# Patient Record
Sex: Female | Born: 1955 | Race: Black or African American | Hispanic: No | Smoking: Current every day smoker
Health system: Southern US, Community
[De-identification: ages and names within clinical notes are randomized; demographics above are authoritative.]

## PROBLEM LIST (undated history)

## (undated) DIAGNOSIS — I10 Essential (primary) hypertension: Secondary | ICD-10-CM

## (undated) DIAGNOSIS — F209 Schizophrenia, unspecified: Secondary | ICD-10-CM

## (undated) HISTORY — DX: Schizophrenia, unspecified: F20.9

---

## 2003-02-16 ENCOUNTER — Other Ambulatory Visit: Payer: Self-pay

## 2010-08-21 ENCOUNTER — Emergency Department: Payer: Self-pay | Admitting: Emergency Medicine

## 2011-10-04 ENCOUNTER — Emergency Department: Payer: Self-pay | Admitting: *Deleted

## 2011-10-04 LAB — BASIC METABOLIC PANEL
Anion Gap: 10 (ref 7–16)
Calcium, Total: 8.6 mg/dL (ref 8.5–10.1)
Co2: 25 mmol/L (ref 21–32)
Creatinine: 0.95 mg/dL (ref 0.60–1.30)
EGFR (African American): >60
Osmolality: 276 (ref 275–301)
Potassium: 3.5 mmol/L (ref 3.5–5.1)

## 2011-10-04 LAB — CBC
HCT: 44.8 % (ref 35.0–47.0)
HGB: 14.3 g/dL (ref 12.0–16.0)
MCH: 30.8 pg (ref 26.0–34.0)
MCHC: 32 g/dL (ref 32.0–36.0)
RBC: 4.65 10*6/uL (ref 3.80–5.20)
RDW: 13.5 % (ref 11.5–14.5)

## 2011-10-04 LAB — CK TOTAL AND CKMB (NOT AT ARMC)
CK, Total: 117 U/L (ref 21–215)
CK-MB: 0.8 ng/mL (ref 0.5–3.6)

## 2014-03-24 ENCOUNTER — Other Ambulatory Visit: Payer: Self-pay | Admitting: Podiatry

## 2014-09-25 DIAGNOSIS — K739 Chronic hepatitis, unspecified: Secondary | ICD-10-CM | POA: Diagnosis not present

## 2014-09-25 DIAGNOSIS — N3942 Incontinence without sensory awareness: Secondary | ICD-10-CM | POA: Diagnosis not present

## 2014-10-17 DIAGNOSIS — K5900 Constipation, unspecified: Secondary | ICD-10-CM | POA: Diagnosis not present

## 2014-10-17 DIAGNOSIS — F209 Schizophrenia, unspecified: Secondary | ICD-10-CM | POA: Diagnosis not present

## 2014-10-17 DIAGNOSIS — I10 Essential (primary) hypertension: Secondary | ICD-10-CM | POA: Diagnosis not present

## 2014-10-17 DIAGNOSIS — K219 Gastro-esophageal reflux disease without esophagitis: Secondary | ICD-10-CM | POA: Diagnosis not present

## 2014-10-26 DIAGNOSIS — N3942 Incontinence without sensory awareness: Secondary | ICD-10-CM | POA: Diagnosis not present

## 2014-10-26 DIAGNOSIS — K739 Chronic hepatitis, unspecified: Secondary | ICD-10-CM | POA: Diagnosis not present

## 2014-11-02 DIAGNOSIS — I10 Essential (primary) hypertension: Secondary | ICD-10-CM | POA: Diagnosis present

## 2014-11-02 DIAGNOSIS — R079 Chest pain, unspecified: Secondary | ICD-10-CM | POA: Diagnosis not present

## 2014-11-02 DIAGNOSIS — K709 Alcoholic liver disease, unspecified: Secondary | ICD-10-CM | POA: Diagnosis present

## 2014-11-02 DIAGNOSIS — R0602 Shortness of breath: Secondary | ICD-10-CM | POA: Diagnosis not present

## 2014-11-02 DIAGNOSIS — F17200 Nicotine dependence, unspecified, uncomplicated: Secondary | ICD-10-CM | POA: Diagnosis not present

## 2014-11-02 DIAGNOSIS — Z8659 Personal history of other mental and behavioral disorders: Secondary | ICD-10-CM | POA: Insufficient documentation

## 2014-11-15 DIAGNOSIS — R0602 Shortness of breath: Secondary | ICD-10-CM | POA: Diagnosis not present

## 2014-11-15 DIAGNOSIS — R079 Chest pain, unspecified: Secondary | ICD-10-CM | POA: Diagnosis not present

## 2014-11-27 DIAGNOSIS — N3942 Incontinence without sensory awareness: Secondary | ICD-10-CM | POA: Diagnosis not present

## 2014-11-27 DIAGNOSIS — K739 Chronic hepatitis, unspecified: Secondary | ICD-10-CM | POA: Diagnosis not present

## 2014-12-05 DIAGNOSIS — F17200 Nicotine dependence, unspecified, uncomplicated: Secondary | ICD-10-CM | POA: Diagnosis not present

## 2014-12-05 DIAGNOSIS — R079 Chest pain, unspecified: Secondary | ICD-10-CM | POA: Diagnosis not present

## 2014-12-05 DIAGNOSIS — I10 Essential (primary) hypertension: Secondary | ICD-10-CM | POA: Diagnosis not present

## 2014-12-26 DIAGNOSIS — K739 Chronic hepatitis, unspecified: Secondary | ICD-10-CM | POA: Diagnosis not present

## 2014-12-26 DIAGNOSIS — N3942 Incontinence without sensory awareness: Secondary | ICD-10-CM | POA: Diagnosis not present

## 2015-01-26 DIAGNOSIS — K739 Chronic hepatitis, unspecified: Secondary | ICD-10-CM | POA: Diagnosis not present

## 2015-01-26 DIAGNOSIS — N3942 Incontinence without sensory awareness: Secondary | ICD-10-CM | POA: Diagnosis not present

## 2015-04-30 DIAGNOSIS — N3942 Incontinence without sensory awareness: Secondary | ICD-10-CM | POA: Diagnosis not present

## 2015-04-30 DIAGNOSIS — K739 Chronic hepatitis, unspecified: Secondary | ICD-10-CM | POA: Diagnosis not present

## 2015-05-28 DIAGNOSIS — K739 Chronic hepatitis, unspecified: Secondary | ICD-10-CM | POA: Diagnosis not present

## 2015-05-28 DIAGNOSIS — N3942 Incontinence without sensory awareness: Secondary | ICD-10-CM | POA: Diagnosis not present

## 2015-06-26 DIAGNOSIS — K739 Chronic hepatitis, unspecified: Secondary | ICD-10-CM | POA: Diagnosis not present

## 2015-06-26 DIAGNOSIS — N3942 Incontinence without sensory awareness: Secondary | ICD-10-CM | POA: Diagnosis not present

## 2015-07-26 DIAGNOSIS — K739 Chronic hepatitis, unspecified: Secondary | ICD-10-CM | POA: Diagnosis not present

## 2015-07-26 DIAGNOSIS — N3942 Incontinence without sensory awareness: Secondary | ICD-10-CM | POA: Diagnosis not present

## 2015-08-27 DIAGNOSIS — N3942 Incontinence without sensory awareness: Secondary | ICD-10-CM | POA: Diagnosis not present

## 2015-08-27 DIAGNOSIS — K739 Chronic hepatitis, unspecified: Secondary | ICD-10-CM | POA: Diagnosis not present

## 2015-09-21 ENCOUNTER — Emergency Department
Admission: EM | Admit: 2015-09-21 | Discharge: 2015-09-21 | Disposition: A | Discharge status: Home / Self Care | Payer: Medicare Other | Attending: Student | Admitting: Student

## 2015-09-21 ENCOUNTER — Encounter: Payer: Self-pay | Admitting: Emergency Medicine

## 2015-09-21 DIAGNOSIS — L304 Erythema intertrigo: Secondary | ICD-10-CM | POA: Insufficient documentation

## 2015-09-21 DIAGNOSIS — L988 Other specified disorders of the skin and subcutaneous tissue: Secondary | ICD-10-CM

## 2015-09-21 DIAGNOSIS — T69022A Immersion foot, left foot, initial encounter: Secondary | ICD-10-CM | POA: Diagnosis not present

## 2015-09-21 DIAGNOSIS — L539 Erythematous condition, unspecified: Secondary | ICD-10-CM | POA: Diagnosis present

## 2015-09-21 MED ORDER — IBUPROFEN 600 MG PO TABS: 600.0000 mg | ORAL_TABLET | Freq: Three times a day (TID) | ORAL | Status: DC

## 2015-09-21 MED ORDER — CEPHALEXIN 500 MG PO CAPS: 500.0000 mg | ORAL_CAPSULE | Freq: Four times a day (QID) | ORAL | Status: AC

## 2015-09-21 MED ORDER — MUPIROCIN CALCIUM 2 % EX CREA
TOPICAL_CREAM | Freq: Once | CUTANEOUS | Status: DC
  Filled 2015-09-21: qty 15

## 2015-09-21 MED ORDER — MUPIROCIN 2 % EX OINT: TOPICAL_OINTMENT | CUTANEOUS | Status: DC

## 2015-09-21 NOTE — ED Provider Notes (Signed)
[facility] Emergency Department Provider Note  ____________________________________________  Time seen: Approximately 11:50 AM  I have reviewed the triage vital signs and the nursing notes.   HISTORY  Chief Complaint Rash    HPI Jessica Luna is a 60 y.o. female presents for evaluation of rash to her left upper thigh. Patient states that she has a history of 6 urinating on herself and leaking through the diapers and she feels like her skin is just rubbing together becoming infected. Pain is described as minimal. But has noticed increased maceration of her skin.   History reviewed. No pertinent past medical history.  There are no active problems to display for this patient.   History reviewed. No pertinent past surgical history.  Current Outpatient Rx  Name  Route  Sig  Dispense  Refill  . cephALEXin (KEFLEX) 500 MG capsule   Oral   Take 1 capsule (500 mg total) by mouth 4 (four) times daily.   40 capsule   0   . ibuprofen (ADVIL,MOTRIN) 600 MG tablet   Oral   Take 1 tablet (600 mg total) by mouth 3 (three) times daily.   30 tablet   0   . mupirocin ointment (BACTROBAN) 2 %      Apply to affected area 3 times daily   22 g   0     Allergies Review of patient's allergies indicates no known allergies.  No family history on file.  Social History Social History  Substance Use Topics  . Smoking status: Never Smoker   . Smokeless tobacco: None  . Alcohol Use: None    Review of Systems Constitutional: No fever/chills Cardiovascular: Denies chest pain. Respiratory: Denies shortness of breath. Gastrointestinal: No abdominal pain.  No nausea, no vomiting.  No diarrhea.  No constipation. Genitourinary: Negative for dysuria. Skin: Positive for skin irritation/maceration. Neurological: Negative for headaches, focal weakness or numbness.  10-point ROS otherwise negative.  ____________________________________________   PHYSICAL  EXAM:  VITAL SIGNS: ED Triage Vitals  Enc Vitals Group     BP 09/21/15 1147 149/88 mmHg     Pulse Rate 09/21/15 1147 85     Resp 09/21/15 1147 16     Temp 09/21/15 1147 97.6 F (36.4 C)     Temp Source 09/21/15 1147 Oral     SpO2 09/21/15 1147 98 %     Weight 09/21/15 1147 185 lb (83.915 kg)     Height 09/21/15 1147 5\' 1"  (1.549 m)     Head Cir --      Peak Flow --      Pain Score 09/21/15 1129 5     Pain Loc --      Pain Edu? --      Excl. in [place]? --     Constitutional: Alert and oriented. Well appearing and in no acute distress. Cardiovascular: Normal rate, regular rhythm. Grossly normal heart sounds.  Good peripheral circulation. Respiratory: Normal respiratory effort.  No retractions. Lungs CTAB. Musculoskeletal: No lower extremity tenderness nor edema.  No joint effusions. Neurologic:  Normal speech and language. No gross focal neurologic deficits are appreciated. No gait instability. Skin:  Skin is warm, dry,. There is an area of erythema with increased irritation noted to the skin. Psychiatric: Mood and affect are normal. Speech and behavior are normal.  ____________________________________________   LABS (all labs ordered are listed, but only abnormal results are displayed)  Labs Reviewed - No data to display ____________________________________________  EKG   ____________________________________________  RADIOLOGY   ____________________________________________   PROCEDURES  Procedure(s) performed: None  Critical Care performed: No  ____________________________________________   INITIAL IMPRESSION / ASSESSMENT AND PLAN / ED COURSE  Pertinent labs & imaging results that were available during my care of the patient were reviewed by me and considered in my medical decision making (see chart for details).  Skin maceration. Rx given for Keflex 500 mg 4 times a day and Bactroban ointment. Patient follow-up closely with PCP for continued wound  evaluation. Concern for decubiti development if not careful. ____________________________________________   FINAL CLINICAL IMPRESSION(S) / ED DIAGNOSES  Final diagnoses:  Maceration of skin     This chart was dictated using voice recognition software/Dragon. Despite best efforts to proofread, errors can occur which can change the meaning. Any change was purely unintentional.   Arlyss Repress, PA-C 09/21/15 1256  Joanne Gavel, MD 09/21/15 1550

## 2015-09-21 NOTE — ED Notes (Signed)
Reports rash on left thigh.

## 2015-09-21 NOTE — ED Notes (Signed)
States she noticed a rash area to inside left upper leg several days ago . Area noted to thigh no drainage

## 2015-09-25 DIAGNOSIS — N3942 Incontinence without sensory awareness: Secondary | ICD-10-CM | POA: Diagnosis not present

## 2015-09-25 DIAGNOSIS — K739 Chronic hepatitis, unspecified: Secondary | ICD-10-CM | POA: Diagnosis not present

## 2015-10-26 DIAGNOSIS — K739 Chronic hepatitis, unspecified: Secondary | ICD-10-CM | POA: Diagnosis not present

## 2015-11-28 DIAGNOSIS — I119 Hypertensive heart disease without heart failure: Secondary | ICD-10-CM | POA: Diagnosis not present

## 2016-01-04 DIAGNOSIS — K739 Chronic hepatitis, unspecified: Secondary | ICD-10-CM | POA: Diagnosis not present

## 2016-02-04 DIAGNOSIS — K739 Chronic hepatitis, unspecified: Secondary | ICD-10-CM | POA: Diagnosis not present

## 2016-03-05 DIAGNOSIS — K739 Chronic hepatitis, unspecified: Secondary | ICD-10-CM | POA: Diagnosis not present

## 2016-05-22 ENCOUNTER — Emergency Department
Admission: EM | Admit: 2016-05-22 | Discharge: 2016-05-23 | Disposition: A | Discharge status: Home / Self Care | Payer: Medicare HMO | Attending: Emergency Medicine | Admitting: Emergency Medicine

## 2016-05-22 ENCOUNTER — Emergency Department: Payer: Medicare HMO

## 2016-05-22 DIAGNOSIS — Z791 Long term (current) use of non-steroidal anti-inflammatories (NSAID): Secondary | ICD-10-CM | POA: Insufficient documentation

## 2016-05-22 DIAGNOSIS — I10 Essential (primary) hypertension: Secondary | ICD-10-CM | POA: Diagnosis not present

## 2016-05-22 DIAGNOSIS — D259 Leiomyoma of uterus, unspecified: Secondary | ICD-10-CM | POA: Insufficient documentation

## 2016-05-22 DIAGNOSIS — N939 Abnormal uterine and vaginal bleeding, unspecified: Secondary | ICD-10-CM | POA: Diagnosis present

## 2016-05-22 HISTORY — DX: Essential (primary) hypertension: I10

## 2016-05-22 LAB — CBC
HCT: 36.8 % (ref 35.0–47.0)
HEMOGLOBIN: 12.2 g/dL (ref 12.0–16.0)
MCH: 30.4 pg (ref 26.0–34.0)
MCHC: 33.3 g/dL (ref 32.0–36.0)
MCV: 91.3 fL (ref 80.0–100.0)
Platelets: 411 10*3/uL (ref 150–440)
RBC: 4.03 MIL/uL (ref 3.80–5.20)
RDW: 12.8 % (ref 11.5–14.5)
WBC: 11.1 10*3/uL — ABNORMAL HIGH (ref 3.6–11.0)

## 2016-05-22 LAB — COMPREHENSIVE METABOLIC PANEL
ALK PHOS: 50 U/L (ref 38–126)
ALT: 15 U/L (ref 14–54)
AST: 27 U/L (ref 15–41)
Albumin: 3.1 g/dL — ABNORMAL LOW (ref 3.5–5.0)
Anion gap: 6 (ref 5–15)
BILIRUBIN TOTAL: 0.2 mg/dL — AB (ref 0.3–1.2)
BUN: 5 mg/dL — ABNORMAL LOW (ref 6–20)
CO2: 30 mmol/L (ref 22–32)
CREATININE: 0.87 mg/dL (ref 0.44–1.00)
Calcium: 8.6 mg/dL — ABNORMAL LOW (ref 8.9–10.3)
Chloride: 100 mmol/L — ABNORMAL LOW (ref 101–111)
Glucose, Bld: 162 mg/dL — ABNORMAL HIGH (ref 65–99)
Potassium: 3.8 mmol/L (ref 3.5–5.1)
Sodium: 136 mmol/L (ref 135–145)
TOTAL PROTEIN: 7.9 g/dL (ref 6.5–8.1)

## 2016-05-22 LAB — URINALYSIS, COMPLETE (UACMP) WITH MICROSCOPIC
BACTERIA UA: NONE SEEN
Bilirubin Urine: NEGATIVE
Glucose, UA: NEGATIVE mg/dL
Ketones, ur: NEGATIVE mg/dL
Leukocytes, UA: NEGATIVE
NITRITE: NEGATIVE
Protein, ur: NEGATIVE mg/dL
SPECIFIC GRAVITY, URINE: 1.015 (ref 1.005–1.030)
pH: 5 (ref 5.0–8.0)

## 2016-05-22 NOTE — ED Notes (Signed)
Pelvic done by Dr. Alfred Levins. Very small amount of blood noted. No clots seen.

## 2016-05-22 NOTE — ED Notes (Addendum)
Pt ambulated into hallway asking about toilet, re-directed back into room and shown the toilet in room. Informed pt that this RN will come back to assess her in a few minutes.

## 2016-05-22 NOTE — ED Provider Notes (Signed)
[facility] Emergency Department Provider Note  ____________________________________________  Time seen: Approximately 11:18 PM  I have reviewed the triage vital signs and the nursing notes.   HISTORY  Chief Complaint Vaginal Bleeding   HPI Jessica Luna is a 61 y.o. female with a history of schizophrenia and hypertensionwho presents for evaluation of vaginal bleeding. Patient reports that she has been having intermittent vaginal spotting for the last 2-3 months. She reports that she has been in menopause for at least 3 years. She told her daughter yesterday that she was having vaginal bleeding and since she didn't go see her primary care doctor yesterday, the daughter decided to bring her in for evaluation. The daughter did not know the patient has been having these episodes for a few months. Patient also endorses lower sharp abdominal pain that is only present when she bears down. Pain is located in the suprapubic region.  No pain at this time. Patient denies vaginal discharge, dysuria, nausea, vomiting, diarrhea, melena, hematemesis. She denies dizziness, chest pain or shortness of breath.  Past Medical History:  Diagnosis Date  . Hypertension     There are no active problems to display for this patient.   No past surgical history on file.  Prior to Admission medications   Medication Sig Start Date End Date Taking? Authorizing Provider  ibuprofen (ADVIL,MOTRIN) 600 MG tablet Take 1 tablet (600 mg total) by mouth 3 (three) times daily. 09/21/15   Arlyss Repress, PA-C  mupirocin ointment Drue Stager) 2 % Apply to affected area 3 times daily 09/21/15 09/20/16  Arlyss Repress, PA-C    Allergies Patient has no known allergies.  No family history on file.  Social History Social History  Substance Use Topics  . Smoking status: Never Smoker  . Smokeless tobacco: Not on file  . Alcohol use Not on file    Review of Systems  Constitutional: Negative  for fever. Eyes: Negative for visual changes. ENT: Negative for sore throat. Neck: No neck pain  Cardiovascular: Negative for chest pain. Respiratory: Negative for shortness of breath. Gastrointestinal: + lower abdominal pain. No vomiting or diarrhea. Genitourinary: Negative for dysuria. + vaginal bleeding Musculoskeletal: Negative for back pain. Skin: Negative for rash. Neurological: Negative for headaches, weakness or numbness. Psych: No SI or HI  ____________________________________________   PHYSICAL EXAM:  VITAL SIGNS: ED Triage Vitals [05/22/16 2127]  Enc Vitals Group     BP (!) 158/91     Pulse Rate (!) 113     Resp 18     Temp 99.3 F (37.4 C)     Temp Source Oral     SpO2 98 %     Weight 180 lb (81.6 kg)     Height 5\' 1"  (1.549 m)     Head Circumference      Peak Flow      Pain Score 7     Pain Loc      Pain Edu?      Excl. in [place]?     Constitutional: Alert and oriented. Well appearing and in no apparent distress. HEENT:      Head: Normocephalic and atraumatic.         Eyes: Conjunctivae are normal. Sclera is non-icteric. EOMI. PERRL      Mouth/Throat: Mucous membranes are moist.       Neck: Supple with no signs of meningismus. Cardiovascular: Regular rate and rhythm. No murmurs, gallops, or rubs. 2+ symmetrical distal pulses are present in all  extremities. No JVD. Respiratory: Normal respiratory effort. Lungs are clear to auscultation bilaterally. No wheezes, crackles, or rhonchi.  Gastrointestinal: Soft, mild ttp over the lower quadrants worse over the suprapubic region. Mass palpable over the RLQ, non distended with positive bowel sounds. No rebound or guarding. Pelvic exam: Normal external genitalia, no rashes or lesions. Small amount of blood in the vaginal vault. Os closed. No cervical motion tenderness.  No uterine or adnexal tenderness.   Musculoskeletal: Nontender with normal range of motion in all extremities. No edema, cyanosis, or erythema of  extremities. Neurologic: Normal speech and language. Face is symmetric. Moving all extremities. No gross focal neurologic deficits are appreciated. Skin: Skin is warm, dry and intact. No rash noted. Psychiatric: Mood and affect are normal. Speech and behavior are normal.  ____________________________________________   LABS (all labs ordered are listed, but only abnormal results are displayed)  Labs Reviewed  CBC - Abnormal; Notable for the following:       Result Value   WBC 11.1 (*)    All other components within normal limits  COMPREHENSIVE METABOLIC PANEL - Abnormal; Notable for the following:    Chloride 100 (*)    Glucose, Bld 162 (*)    BUN 5 (*)    Calcium 8.6 (*)    Albumin 3.1 (*)    Total Bilirubin 0.2 (*)    All other components within normal limits  URINALYSIS, COMPLETE (UACMP) WITH MICROSCOPIC - Abnormal; Notable for the following:    Color, Urine YELLOW (*)    APPearance HAZY (*)    Hgb urine dipstick LARGE (*)    Squamous Epithelial / LPF 6-30 (*)    All other components within normal limits   ____________________________________________  EKG  none ____________________________________________  RADIOLOGY  TVUS: Limited study due to acoustic degradation from masses measuring up to 12 cm; these are not conclusively characterized but probably represent fibroids. Neither ovary was visible. Endometrium not well seen. Pelvic MRI would be the modality of choice for additional imaging evaluation.  CT a/p: Markedly enlarged uterus containing what appear to be numerous fibroids some of which are hypodense and necrotic in appearance attention contributing to the patient's vaginal bleeding. MRI is recommended for further assessment to exclude leiomyosarcoma. Pertinent negatives with the no metastatic foci of the liver nor visualized lung bases. There are small retroperitoneal lymph nodes however noted measuring up to 1.5  cm. ____________________________________________   PROCEDURES  Procedure(s) performed: None Procedures Critical Care performed:  None ____________________________________________   INITIAL IMPRESSION / ASSESSMENT AND PLAN / ED COURSE   61 y.o. female with a history of schizophrenia and hypertensionwho presents for evaluation of vaginal spotting intermittently for 2-3 months and lower abdominal pain when she bears down. On exam patient is well-appearing, with no distress, pelvic exam shows small amount of blood in the vaginal vault, normal os. Palpation of her abdomen reveals a mass on the right lower quadrant and mild tenderness to palpation on the lower quadrants with no rebound or guarding. We'll send patient for a transvaginal ultrasound and if that is negative for CT abdomen and pelvis.  Clinical Course as of May 23 349  Fri May 23, 2016  0347 CT showing Markedly enlarged uterus containing what appear to be numerous fibroids some of which are hypodense and necrotic in appearance attention contributing to the patient's vaginal bleeding. MRI is recommended for further assessment to exclude leiomyosarcoma. Pertinent negatives with the no metastatic foci of the liver nor visualized lung bases. There are  small retroperitoneal lymph nodes.  I discussed these findings with patient and recommended close follow up with OBgyn for further management including further imaging and possible biopsy/ Patient will follow up with Dr. Glennon Mac, [facility]. Patient has medicaid to ensure close follow up. I explained to the patient and her daughter that she needs close evaluation to rule out malignancy. Daughter will call the clinic tomorrow first thing for an appointment. however noted measuring up to 1.5 cm.  [CV]    Clinical Course User Index [CV] Rudene Re, MD    Pertinent labs & imaging results that were available during my care of the patient were reviewed by me and considered in my  medical decision making (see chart for details).    ____________________________________________   FINAL CLINICAL IMPRESSION(S) / ED DIAGNOSES  Final diagnoses:  Vaginal bleeding  Uterine leiomyoma, unspecified location      NEW MEDICATIONS STARTED DURING THIS VISIT:  New Prescriptions   No medications on file     Note:  This document was prepared using Dragon voice recognition software and may include unintentional dictation errors.    Rudene Re, MD 05/23/16 [phone]

## 2016-05-22 NOTE — ED Triage Notes (Signed)
Pt in with co lower abd pain and vaginal bleeding for 2 weeks, has not had cycles for years and unsure of cause. Pt has appointment Monday for pcp but wants to be checked out today. Pt also co lower abd pain.

## 2016-05-23 ENCOUNTER — Emergency Department: Payer: Medicare HMO

## 2016-05-23 DIAGNOSIS — D259 Leiomyoma of uterus, unspecified: Secondary | ICD-10-CM | POA: Diagnosis not present

## 2016-05-23 MED ORDER — IOPAMIDOL (ISOVUE-300) INJECTION 61%
100.0000 mL | Freq: Once | INTRAVENOUS | Status: AC | PRN
  Administered 2016-05-23: 100 mL via INTRAVENOUS

## 2016-05-23 MED ORDER — IOPAMIDOL (ISOVUE-300) INJECTION 61%
30.0000 mL | Freq: Once | INTRAVENOUS | Status: AC | PRN
  Administered 2016-05-23: 30 mL via ORAL

## 2016-05-23 MED ORDER — KETOROLAC TROMETHAMINE 30 MG/ML IJ SOLN
10.0000 mg | Freq: Once | INTRAMUSCULAR | Status: AC
  Administered 2016-05-23: 9.9 mg via INTRAVENOUS
  Filled 2016-05-23: qty 1

## 2016-05-23 NOTE — ED Notes (Signed)
Pt ambulatory to toilet with no assistance.  

## 2016-05-23 NOTE — Discharge Instructions (Signed)
As I explained to you and your CT scan showed multiple masses in your uterus. We are unable to determine if these masses are cancer at this time. Therefore it is imperative that you call OB/GYN tomorrow for close follow-up so you can undergo further evaluation. In the meantime, return to the emergency room if you have worsening vaginal bleeding, worsening abdominal pain, or any new symptoms that were not present today.

## 2016-05-23 NOTE — ED Notes (Signed)
Pt finished drinking contrast, CT informed

## 2016-05-27 ENCOUNTER — Telehealth: Payer: Self-pay | Admitting: Obstetrics and Gynecology

## 2016-05-27 NOTE — Telephone Encounter (Signed)
Voicemail not set up, patient needs to schedule ER follow up.

## 2016-06-24 ENCOUNTER — Ambulatory Visit (INDEPENDENT_AMBULATORY_CARE_PROVIDER_SITE_OTHER): Payer: Commercial Managed Care - HMO | Admitting: Obstetrics and Gynecology

## 2016-06-24 ENCOUNTER — Encounter: Payer: Self-pay | Admitting: Obstetrics and Gynecology

## 2016-06-24 VITALS — BP 128/74 | Ht 61.0 in | Wt 180.0 lb

## 2016-06-24 DIAGNOSIS — N950 Postmenopausal bleeding: Secondary | ICD-10-CM | POA: Diagnosis not present

## 2016-06-24 DIAGNOSIS — R1030 Lower abdominal pain, unspecified: Secondary | ICD-10-CM | POA: Diagnosis not present

## 2016-06-24 MED ORDER — TRAMADOL HCL 50 MG PO TABS: 50.0000 mg | ORAL_TABLET | Freq: Four times a day (QID) | ORAL | 0 refills | Status: DC | PRN

## 2016-06-24 NOTE — Progress Notes (Signed)
Obstetrics & Gynecology Office Visit   Chief Complaint  Patient presents with  . ER Follow Up  postmenopausal bleeding, lower abdominal pain  History of Present Illness:  Postmenopausal Bleeding: Patient complains of vaginal bleeding. She has been menopausal for 3 years. Currently on no HRT. Bleeding is described as spotting and has occurred 2 times. Other menopausal symptoms: none. Workup to date: CBC, pelvic ultrasound and CT abdomen/pelvis. She presented to the ER on 05/22/16 for primarily abdominal pain (see below). She also noted an episode of vaginal spotting that lasted a few days. She had a previous episode of spotting a couple of months ago.  She does not remember when she has had a pap smear last.  She had a CT scan in the ER that showed multiple large masses, likely fibroids, though leiomyosarcoma could not be ruled out.  The larges mass was 12 cm.  She also had a pelvic ultrasound that could adequately visualize her endometrial stripe due to the shadowing effects of her uterine masses.    Abdominal Pain: Patient complains of abdominal pain. The pain is described as cramping and dull, and is 5/10 in intensity. Pain is located in the suprapubic with radiation to low back. Onset was 2 months ago. Symptoms have been gradually worsening since. Aggravating factors: activity.  Alleviating factors: none. Associated symptoms: constipation and early satiety, and bloating. The patient denies diarrhea, frequency, hematochezia, hematuria, melena, nausea and vomiting. Prior to 2 months ago she has never had abdominal pain like this.   The patient states that she had normal menses until she went through the menopausal transition.  Her menses were never heavy nor painful.  She does not have a history of abdominal discomfort.    Review of Systems  Constitutional: Negative.   HENT: Negative.   Eyes: Negative.   Respiratory: Negative.   Cardiovascular: Negative.   Gastrointestinal: Positive for  abdominal pain and constipation. Negative for blood in stool, diarrhea, melena, nausea and vomiting.       Bloating, early satiety  Genitourinary: Negative.        +vaginal spotting  Musculoskeletal: Negative.   Skin: Negative.   Neurological: Negative.   Psychiatric/Behavioral: Negative.     Past Medical History:  Diagnosis Date  . Hypertension   . Schizophrenia [facility])     Past Surgical History:  Procedure Laterality Date  . CESAREAN SECTION      Gynecologic History: No LMP recorded. Patient is postmenopausal.  Obstetric History: [id number], status post cesarean section x 4, one abortion  Family History  Problem Relation Age of Onset  . Diabetes Father   Denies gynecologic cancers  Social History   Social History  . Marital status: Widowed    Spouse name: N/A  . Number of children: N/A  . Years of education: N/A   Occupational History  . Not on file.   Social History Main Topics  . Smoking status: Current Every Day Smoker  . Smokeless tobacco: Never Used  . Alcohol use Yes  . Drug use: Unknown  . Sexual activity: Not Currently    Birth control/ protection: Post-menopausal   Other Topics Concern  . Not on file   Social History Narrative  . No narrative on file    Allergies: No Known Allergies  Medications:   Medication Sig Start Date End Date Taking? Authorizing Provider  amLODipine-benazepril (LOTREL) 10-20 MG capsule  04/13/16  Yes Historical Provider, MD  risperiDONE (RISPERDAL) 2 MG tablet  10/26/14  Yes Historical  Provider, MD  ibuprofen (ADVIL,MOTRIN) 600 MG tablet Take 1 tablet (600 mg total) by mouth 3 (three) times daily. Patient not taking: Reported on 06/24/2016 09/21/15   Arlyss Repress, PA-C  mupirocin ointment Drue Stager) 2 % Apply to affected area 3 times daily Patient not taking: Reported on 06/24/2016 09/21/15 09/20/16  Arlyss Repress, PA-C   Physical Exam BP 128/74   Ht 5\' 1"  (1.549 m)   Wt 180 lb (81.6 kg)   BMI 34.01 kg/m   No LMP  recorded. Patient is postmenopausal. Physical Exam  Constitutional: She is oriented to person, place, and time. She appears well-developed and well-nourished. No distress.  Genitourinary: Pelvic exam was performed with patient supine. There is no rash, tenderness, lesion or injury on the right labia. There is no rash, tenderness or injury on the left labia. No erythema, tenderness or bleeding in the vagina. No signs of injury around the vagina. Cervix does not exhibit motion tenderness.   Uterus is enlarged, exhibiting a mass, irregular and anteverted. Uterus is not tender or mobile.  Genitourinary Comments: Unable to visualize cervix as it is immediately retropubic.   Uterus is enlarged and consistent with abdominal exam anteriorly.   Rectovaginal confirms bimanual. Posteriorly her uterus/fibroids fill the pelvis and there is very limited mobility.   HENT:  Head: Normocephalic and atraumatic.  Eyes: EOM are normal. No scleral icterus.  Neck: Normal range of motion. Neck supple. No thyromegaly present.  Cardiovascular: Normal rate and regular rhythm.  Exam reveals no gallop and no friction rub.   No murmur heard. Pulmonary/Chest: Effort normal. No respiratory distress. She has wheezes. She has no rales. Right breast exhibits no inverted nipple, no mass, no nipple discharge, no skin change and no tenderness. Left breast exhibits no inverted nipple, no mass, no nipple discharge, no skin change and no tenderness.  Abdominal: Soft. Bowel sounds are normal. She exhibits mass (mass extends up to about 2 cm inferior to umbilicus, about 12 cm to right of midlines and about 6cm left of midline). She exhibits no distension. There is no tenderness. There is no rebound and no guarding.  Musculoskeletal: Normal range of motion. She exhibits no edema or tenderness.  Lymphadenopathy:    She has no cervical adenopathy.       Right: No inguinal adenopathy present.       Left: No inguinal adenopathy present.    Neurological: She is alert and oriented to person, place, and time. No cranial nerve deficit.  Skin: Skin is warm and dry. No rash noted. No erythema.  Psychiatric: She has a normal mood and affect. Her behavior is normal. Judgment normal.   Female chaperone present for pelvic and breast  portions of the physical exam  CT abdomen/pelvis report (05/23/16): Vascular/Lymphatic: No aortic aneurysm. Mildly enlarged retroperitoneal, para-aortic lymph nodes the largest approximately 1.6 cm short axis at the bifurcation.  Reproductive: The uterus is markedly enlarged measuring 15.5 cm AP x 13.4 cm craniocaudad by 16.9 cm transverse. It demonstrates heterogeneous enhancement predominantly solid and anterior on the right and more hypodense cystic or necrotic posteriorly and on the left. Speckled coarse calcifications are noted within the right-sided uterine masses. Findings are likely related to numerous leiomyomas however the possibility of leiomyosarcoma is of concern. MRI is recommended for further evaluation. Neither ovary is visualized.  IMPRESSION: Markedly enlarged uterus containing what appear to be numerous fibroids some of which are hypodense and necrotic in appearance attention contributing to the patient's vaginal bleeding. MRI is  recommended for further assessment to exclude leiomyosarcoma. Pertinent negatives with the no metastatic foci of the liver nor visualized lung bases. There are small retroperitoneal lymph nodes however noted measuring up to 1.5 cm.  Pelvic ultrasound report (05/23/16): FINDINGS: Uterus Measurements: 15.9 x 9.3 x 15.1 cm. At least 2 masses, one located in the fundus to the right of midline measuring 7 cm and another mass to the left of the uterus measuring up to 12 cm.  Endometrium Thickness: 6.0 mm. Poorly seen due to acoustic degradation from the masses.  Right ovary: Not visible  Left ovary: Not visible  Other findings: No abnormal free  fluid.  IMPRESSION: Limited study due to acoustic degradation from masses measuring up to 12 cm; these are not conclusively characterized but probably represent fibroids. Neither ovary was visible. Endometrium not well seen. Pelvic MRI would be the modality of choice for additional imaging evaluation.  Assessment: 61 y.o. [id number] with postmenopausal bleeding and suprapubic abdominal pain.  Imaging findings concerning for uterine fibroids, but leiomyosarcoma could not be ruled out.   Plan: Postmenopausal bleeding: I was unable to perform a pap smear and an endometrial biopsy in the office today given the location of her cervix being directly behind the pubic bone and her intolerance of the exam.   Her imaging findings, along with her new-onset abdominal pain and vaginal bleeding have me concerned for malignancy.  This is especially true given that she had no abnormal uterine bleeding or abdominal pain during her pre-menstrual years.   I will refer her to [facility] for further assessment and recommendations.  Discussed that an MRI might be beneficial. However, given her new-onset symptoms, she would like need, at a minimum, an exam under anesthesia for endometrial pathology assessment.  Also, given her symptoms, it may be recommended that she have a hysterectomy. Given the size of her uterus and some concern for malignancy, this is why I will refer her to [facility]. Ideally, I would be able to perform a pap smear and an endometrial biopsy, but I am unable to do so in clinic today.    I explained this recommendation to her along with my rationale and my concern for malignancy with difficulty in assessing this short of performing a hysterectomy.  She voiced understanding of the importance of keeping follow up appointments and referrals.   For this appointment, I reviewed ER records and imaging and had a long, face-to-face discussion with her regarding my recommendation. About 45 minutes  spent in total on this patient.   Prentice Docker, MD 06/24/2016 5:35 PM

## 2016-07-02 ENCOUNTER — Inpatient Hospital Stay: Payer: Medicare HMO

## 2016-11-13 ENCOUNTER — Encounter: Payer: Self-pay | Admitting: Emergency Medicine

## 2016-11-13 ENCOUNTER — Emergency Department: Payer: Medicare HMO

## 2016-11-13 ENCOUNTER — Emergency Department
Admission: EM | Admit: 2016-11-13 | Discharge: 2016-11-13 | Disposition: A | Discharge status: Home / Self Care | Payer: Medicare HMO | Attending: Emergency Medicine | Admitting: Emergency Medicine

## 2016-11-13 DIAGNOSIS — R1013 Epigastric pain: Secondary | ICD-10-CM

## 2016-11-13 DIAGNOSIS — F209 Schizophrenia, unspecified: Secondary | ICD-10-CM | POA: Insufficient documentation

## 2016-11-13 DIAGNOSIS — I10 Essential (primary) hypertension: Secondary | ICD-10-CM | POA: Insufficient documentation

## 2016-11-13 DIAGNOSIS — R109 Unspecified abdominal pain: Secondary | ICD-10-CM

## 2016-11-13 DIAGNOSIS — F17210 Nicotine dependence, cigarettes, uncomplicated: Secondary | ICD-10-CM | POA: Diagnosis not present

## 2016-11-13 DIAGNOSIS — Z79899 Other long term (current) drug therapy: Secondary | ICD-10-CM | POA: Insufficient documentation

## 2016-11-13 DIAGNOSIS — R079 Chest pain, unspecified: Secondary | ICD-10-CM | POA: Diagnosis present

## 2016-11-13 LAB — HEPATIC FUNCTION PANEL
AST: 82 U/L — AB (ref 15–41)
Albumin: <1 g/dL — ABNORMAL LOW (ref 3.5–5.0)
Alkaline Phosphatase: 15 U/L — ABNORMAL LOW (ref 38–126)
BILIRUBIN TOTAL: 0.1 mg/dL — AB (ref 0.3–1.2)
Total Protein: <3 g/dL — ABNORMAL LOW (ref 6.5–8.1)

## 2016-11-13 LAB — CBC
HCT: 40.7 % (ref 35.0–47.0)
Hemoglobin: 14 g/dL (ref 12.0–16.0)
MCH: 31.6 pg (ref 26.0–34.0)
MCHC: 34.5 g/dL (ref 32.0–36.0)
MCV: 91.6 fL (ref 80.0–100.0)
Platelets: 208 10*3/uL (ref 150–440)
RBC: 4.45 MIL/uL (ref 3.80–5.20)
RDW: 13 % (ref 11.5–14.5)
WBC: 5.2 10*3/uL (ref 3.6–11.0)

## 2016-11-13 LAB — BASIC METABOLIC PANEL
ANION GAP: 11 (ref 5–15)
BUN: 9 mg/dL (ref 6–20)
CO2: 25 mmol/L (ref 22–32)
Calcium: 8.8 mg/dL — ABNORMAL LOW (ref 8.9–10.3)
Chloride: 98 mmol/L — ABNORMAL LOW (ref 101–111)
Creatinine, Ser: 1.1 mg/dL — ABNORMAL HIGH (ref 0.44–1.00)
GFR, EST NON AFRICAN AMERICAN: 54 mL/min — AB (ref >60)
GLUCOSE: 86 mg/dL (ref 65–99)
Potassium: 3.8 mmol/L (ref 3.5–5.1)
Sodium: 134 mmol/L — ABNORMAL LOW (ref 135–145)

## 2016-11-13 LAB — LIPASE, BLOOD: Lipase: 33 U/L (ref 11–51)

## 2016-11-13 LAB — TROPONIN I
Troponin I: <0.03 ng/mL (ref <0.03)
Troponin I: <0.03 ng/mL

## 2016-11-13 MED ORDER — OMEPRAZOLE 20 MG PO CPDR: 20.0000 mg | DELAYED_RELEASE_CAPSULE | Freq: Every day | ORAL | 0 refills | Status: DC

## 2016-11-13 NOTE — ED Notes (Signed)
Pt provided graham crackers w/ peanut butter and chocolate milk upon request.

## 2016-11-13 NOTE — ED Notes (Signed)
Patient transported to X-ray 

## 2016-11-13 NOTE — Discharge Instructions (Signed)
If you have new or worrisome symptms including chest pain, shortness of breath, breathing diffuculties, bleeding from your bottom or black stools, or vomiting or you feel worse in any way return to the emergency department.

## 2016-11-13 NOTE — ED Notes (Signed)
Patient transported to Ultrasound 

## 2016-11-13 NOTE — ED Triage Notes (Signed)
Pt reports right side non radiating chest pain without associated symptoms for one week.Pt ambulatory to triage. No apparent distress noted.

## 2016-11-13 NOTE — ED Provider Notes (Signed)
[facility] Emergency Department Provider Note  ____________________________________________   I have reviewed the triage vital signs and the nursing notes.   HISTORY  Chief Complaint Chest Pain    HPI Jessica Luna is a 61 y.o. female Who pas a history of schizophrenia, as well ashypertension presents today complaining of epigastric abdominal pain for one week. Not exertional. No shortness of breath. Not pleuritic. Nothing makes it better, food sometimes makes it worse. Goes towards the right side of her abdomen. This is not chest pain, when I ask her what hurts she clearly points to her epigastric region. She is no history of melena bright red blood per rectum or hematemesis. History is somewhat limited given patient's schizophrenia but I believe she is answering questions appropriately.  She denies any fever or chills, chest pain or shortness of breath probably do fine, or any other concomitant concerns. No leg swelling. No personal or family history of PE or DVT.   Past Medical History:  Diagnosis Date  . Hypertension   . Schizophrenia [facility])     Patient Active Problem List   Diagnosis Date Noted  . Lower abdominal pain 06/24/2016  . Postmenopausal bleeding 06/24/2016    Past Surgical History:  Procedure Laterality Date  . CESAREAN SECTION      Prior to Admission medications   Medication Sig Start Date End Date Taking? Authorizing Provider  amLODipine-benazepril (LOTREL) 10-20 MG capsule Take 1 capsule by mouth daily.  04/13/16  Yes [provider]  omeprazole (PRILOSEC) 20 MG capsule Take 20 mg by mouth daily. 10/27/16  Yes [provider]  RISPERDAL CONSTA 50 MG injection Inject 50 mg into the skin every 14 (fourteen) days. 11/10/16  Yes [provider]  traMADol (ULTRAM) 50 MG tablet Take 1 tablet (50 mg total) by mouth every 6 (six) hours as needed (breakthrough pain). 06/24/16  Yes Will Bonnet, MD     Allergies Patient has no known allergies.  Family History  Problem Relation Age of Onset  . Diabetes Father     Social History Social History  Substance Use Topics  . Smoking status: Current Every Day Smoker  . Smokeless tobacco: Never Used  . Alcohol use Yes    Review of Systems Constitutional: No fever/chills Eyes: No visual changes. ENT: No sore throat. No stiff neck no neck pain Cardiovascular: Denies chest pain. Respiratory: Denies shortness of breath. Gastrointestinal:   no vomiting.  No diarrhea.  No constipation. Genitourinary: Negative for dysuria. Musculoskeletal: Negative lower extremity swelling Skin: Negative for rash. Neurological: Negative for severe headaches, focal weakness or numbness.   ____________________________________________   PHYSICAL EXAM:  VITAL SIGNS: ED Triage Vitals [11/13/16 1104]  Enc Vitals Group     BP (!) 177/88     Pulse Rate 71     Resp 18     Temp 98.6 F (37 C)     Temp Source Oral     SpO2 99 %     Weight 185 lb (83.9 kg)     Height 5\' 1"  (1.549 m)     Head Circumference      Peak Flow      Pain Score 7     Pain Loc      Pain Edu?      Excl. in [place]?     Constitutional: Alert and oriented. Well appearing and in no acute distress. Eyes: Conjunctivae are normal Head: Atraumatic HEENT: No congestion/rhinnorhea. Mucous membranes are moist.  Oropharynx non-erythematous  Neck:   Nontender with no meningismus, no masses, no stridor Cardiovascular: Normal rate, regular rhythm. Grossly normal heart sounds.  Good peripheral circulation. Respiratory: Normal respiratory effort.  No retractions. Lungs CTAB. Abdominal: Soft + epigastric tenderness, also some minimal right upper quadrant tenderness. No distention. No guarding no rebound Back:  There is no focal tenderness or step off.  there is no midline tenderness there are no lesions noted. there is no CVA tenderness Musculoskeletal: No lower extremity tenderness, no upper  extremity tenderness. No joint effusions, no DVT signs strong distal pulses no edema Neurologic:  Normal speech and language. No gross focal neurologic deficits are appreciated.  Skin:  Skin is warm, dry and intact. No rash noted. Psychiatric: Mood and affect are normal. Speech and behavior are normal.  ____________________________________________   LABS (all labs ordered are listed, but only abnormal results are displayed)  Labs Reviewed  CBC  BASIC METABOLIC PANEL  TROPONIN I  HEPATIC FUNCTION PANEL  LIPASE, BLOOD   ____________________________________________  EKG  I personally interpreted any EKGs ordered by me or triage Rate 74 bpm, no acute ST elevation or depreson, normal sinus rhythm, nonspecific ST changes, no acute ischemia ____________________________________________  RADIOLOGY  I reviewed any imaging ordered by me or triage that were performed during my shift and, if possible, patient and/or family made aware of any abnormal findings. ____________________________________________   PROCEDURES  Procedure(s) performed: None  Procedures  Critical Care performed: None  ____________________________________________   INITIAL IMPRESSION / ASSESSMENT AND PLAN / ED COURSE  Pertinent labs & imaging results that were available during my care of the patient were reviewed by me and considered in my medical decision making (see chart for details).  Patient with 1 week of repreproducible abdominal pain, no fevers or chills, no chest pain or shortness of breath. We'll obtain cardiac enzymes is a precaution but I think serial enzymes will be indicated given chronicity of symptoms and history. The patient does have reproducible epigastric right upper quadrant pain differential includes pinker tenderness, gallbladder disease, andstomach issues such as gastritis or reflux disease or ulcer, no evidence of GI bleed on exam, hemoglobin 14 despite 1 week of symptoms, we'll obtain  rest for quadrant ultrasound, patient is requesting food and drink but we will keep her nothing by mouth pending further evaluation.    ____________________________________________   FINAL CLINICAL IMPRESSION(S) / ED DIAGNOSES  Final diagnoses:  Abdominal pain      This chart was dictated using voice recognition software.  Despite best efforts to proofread,  errors can occur which can change meaning.      Schuyler Amor, MD 11/13/16 [phone]

## 2017-04-06 ENCOUNTER — Ambulatory Visit: Payer: Self-pay | Admitting: Obstetrics and Gynecology

## 2017-04-10 ENCOUNTER — Ambulatory Visit (INDEPENDENT_AMBULATORY_CARE_PROVIDER_SITE_OTHER): Payer: Commercial Managed Care - HMO | Admitting: Obstetrics and Gynecology

## 2017-04-10 VITALS — BP 136/84 | Ht 61.0 in | Wt 192.0 lb

## 2017-04-10 DIAGNOSIS — N950 Postmenopausal bleeding: Secondary | ICD-10-CM | POA: Diagnosis not present

## 2017-04-10 DIAGNOSIS — R1030 Lower abdominal pain, unspecified: Secondary | ICD-10-CM

## 2017-04-10 DIAGNOSIS — D259 Leiomyoma of uterus, unspecified: Secondary | ICD-10-CM | POA: Diagnosis not present

## 2017-04-10 NOTE — Progress Notes (Signed)
Obstetrics & Gynecology Office Visit   Chief Complaint  Patient presents with  . Pelvic Pain  . Menstrual Problem   History of Present Illness: 62 y.o. Jessica Luna female who presents in follow up from postmenopausal bleeding. She was seen by me last April.  Workup prior to her presentation to me in the ER showed a fibroid uterus with findings somewhat concerning for possible leiomyosarcoma.  I referred the patient to [facility] for management.  According to notes obtained via Care Everywhere, she was seen in May 2018, she was scheduled for a TAH/BSO.  However, the patient was lost to follow up and has not contacted [facility] since her consultation.  She continues to have intermittent vaginal bleeding and abdominal pain.  Her weight has actually increased since her last visit with me in April 2018.     Past Medical History:  Diagnosis Date  . Hypertension   . Schizophrenia [facility])     Past Surgical History:  Procedure Laterality Date  . CESAREAN SECTION      Gynecologic History: No LMP recorded. Patient is postmenopausal.  Obstetric History: [id number]  Family History  Problem Relation Age of Onset  . Diabetes Father     Social History   Socioeconomic History  . Marital status: Widowed    Spouse name: Not on file  . Number of children: Not on file  . Years of education: Not on file  . Highest education level: Not on file  Social Needs  . Financial resource strain: Not on file  . Food insecurity - worry: Not on file  . Food insecurity - inability: Not on file  . Transportation needs - medical: Not on file  . Transportation needs - non-medical: Not on file  Occupational History  . Not on file  Tobacco Use  . Smoking status: Current Every Day Smoker  . Smokeless tobacco: Never Used  Substance and Sexual Activity  . Alcohol use: Yes  . Drug use: Not on file  . Sexual activity: Not Currently    Birth control/protection: Post-menopausal  Other Topics Concern  . Not on file    Social History Narrative  . Not on file    Allergies  Allergen Reactions  . Codeine Nausea And Vomiting    Medications   Medication Sig Start Date End Date Taking? Authorizing Provider  amLODipine-benazepril (LOTREL) 10-20 MG capsule Take 1 capsule by mouth daily.  04/13/16  Yes [provider]  omeprazole (PRILOSEC) 20 MG capsule Take 1 capsule (20 mg total) by mouth daily. 11/13/16  Yes McShane, Gerda Diss, MD  RISPERDAL CONSTA 50 MG injection Inject 50 mg into the skin every 14 (fourteen) days. 11/10/16  Yes [provider]  traMADol (ULTRAM) 50 MG tablet Take 1 tablet (50 mg total) by mouth every 6 (six) hours as needed (breakthrough pain). 06/24/16  Yes Will Bonnet, MD    Review of Systems  Constitutional: Negative.   HENT: Negative.   Eyes: Negative.   Respiratory: Negative.   Cardiovascular: Negative.   Gastrointestinal: Positive for abdominal pain and constipation. Negative for blood in stool, diarrhea, heartburn, melena, nausea and vomiting.       Bloating  Genitourinary: Negative.   Musculoskeletal: Negative.   Skin: Negative.   Neurological: Negative.   Psychiatric/Behavioral: Negative.      Physical Exam BP 136/84   Ht 5\' 1"  (1.549 m)   Wt 192 lb (87.1 kg)   BMI 36.28 kg/m  No LMP recorded. Patient is postmenopausal. Physical  Exam  Constitutional: She is oriented to person, place, and time. She appears well-developed and well-nourished. No distress.  HENT:  Head: Normocephalic and atraumatic.  Eyes: Conjunctivae are normal. No scleral icterus.  Neurological: She is alert and oriented to person, place, and time. No cranial nerve deficit.  Psychiatric: She has a normal mood and affect. Her behavior is normal. Judgment normal.    Female chaperone present for pelvic and breast  portions of the physical exam  Assessment: 62 y.o. [id number] female here for  1. Postmenopausal bleeding   2. Lower abdominal pain   3. Uterine leiomyoma,  unspecified location      Plan: Problem List Items Addressed This Visit      Genitourinary   Fibroid uterus     Other   Lower abdominal pain   Postmenopausal bleeding - Primary     I in very strong wording recommend this patient to connect with [facility] again and complete the surgery.  I discussed with her that I am concerned that she may have cancer and that the longer she delays treatment, the worse her prognosis could be.  I reiterated to her that [place] was the place for her to receive her treatment. I offered to send her to [place] for a second opinion. However, she states that she is comfortable with [facility].  I offered to assist her in any way possible to get her back to [facility] for treatment.  It has been less than a year since she has been seen by [facility]. So, she should not need a referral.  However, if she does, I am happy to make that referral ASAP.  She voiced understanding of my concerns regarding cancer and my recommendations.  She also voiced agreement to the plan to return to [facility] ASAP.  20 minutes spent in face to face discussion with > 50% spent in counseling, management, and coordination of care of her postmenopausal bleeding, abdominal pain, fibroid uterus.   Prentice Docker, MD 04/11/2017 4:20 PM

## 2017-04-11 ENCOUNTER — Encounter: Payer: Self-pay | Admitting: Obstetrics and Gynecology

## 2017-04-11 DIAGNOSIS — D259 Leiomyoma of uterus, unspecified: Secondary | ICD-10-CM | POA: Insufficient documentation

## 2017-09-23 IMAGING — US US ABDOMEN LIMITED
1 series · 14 of 25 positions shown · non-contrast
Comparison: CT abdomen pelvis dated May 23, 2016.

CLINICAL DATA: Epigastric abdominal pain for 1 week.

EXAM:
ULTRASOUND ABDOMEN LIMITED RIGHT UPPER QUADRANT

[Series 1: us abdomen limited · 0.19mm/px · 14 of 48 slices shown]
[im 1/48]
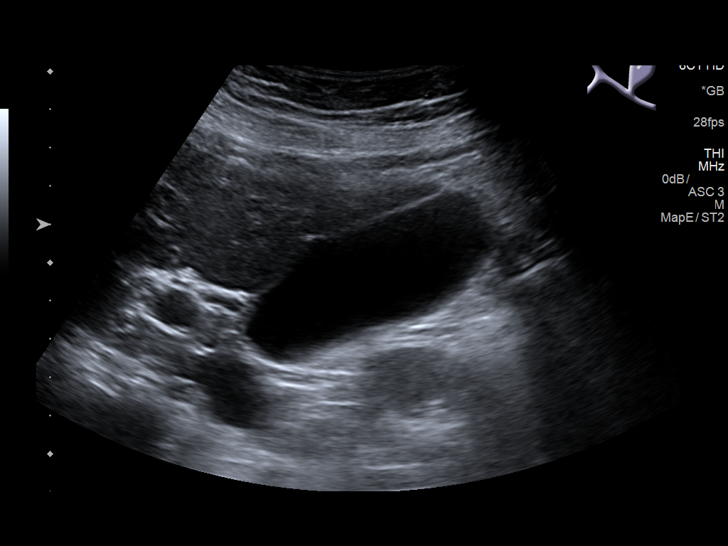
[im 4/48]
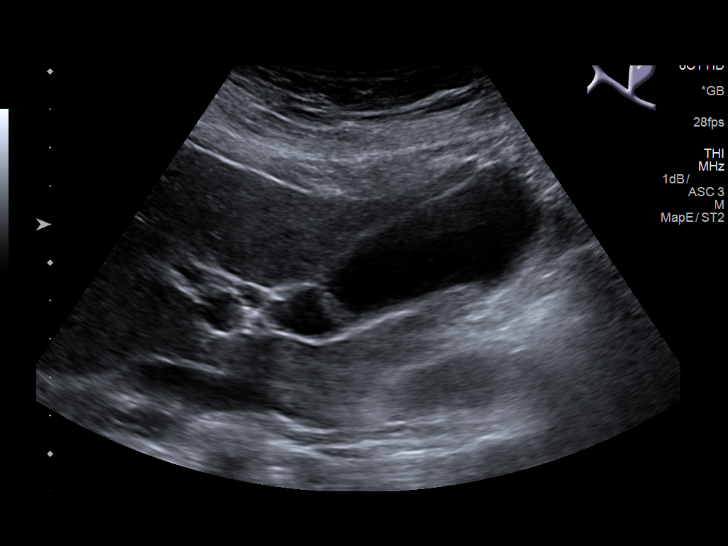
[im 8/48]
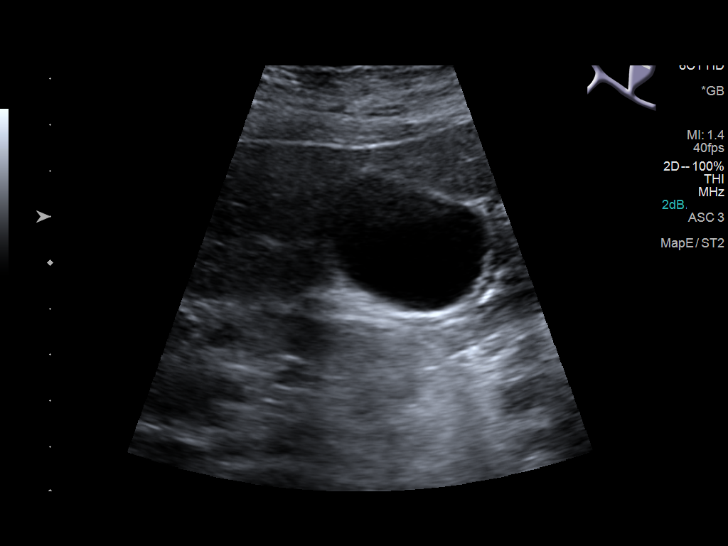
[im 12/48]
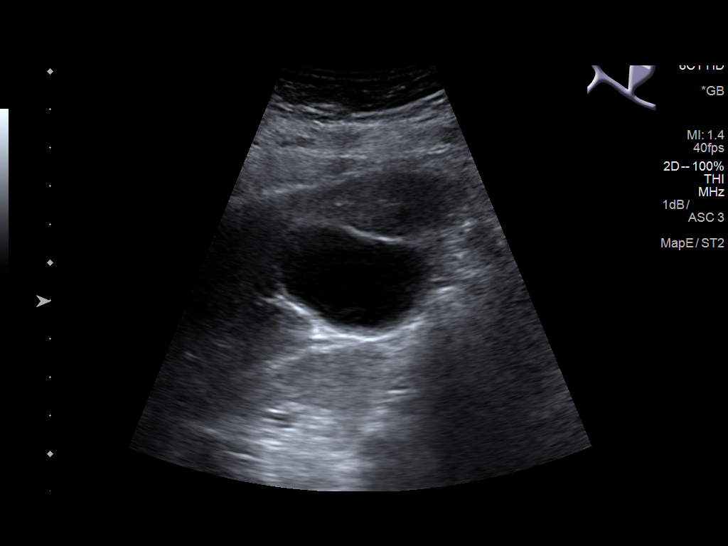
[im 16/48]
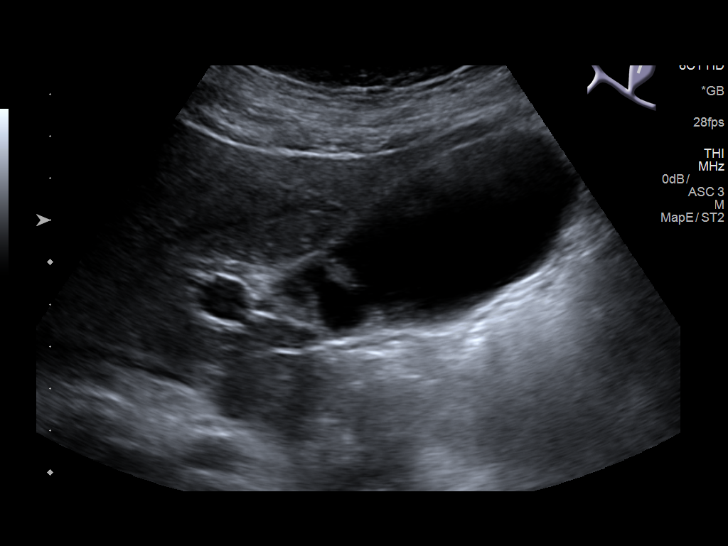
[im 18/48]
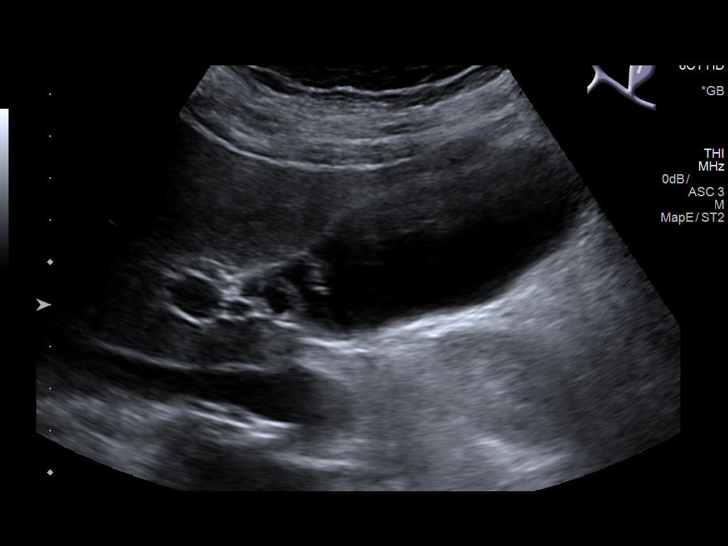
[im 22/48]
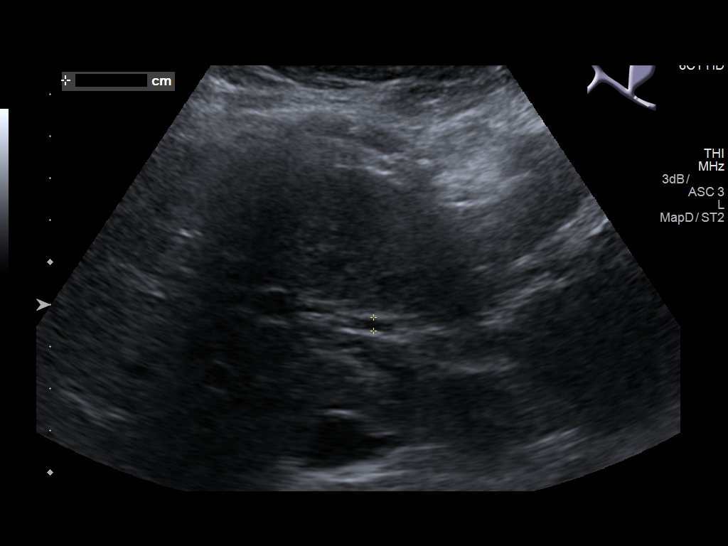
[im 26/48]
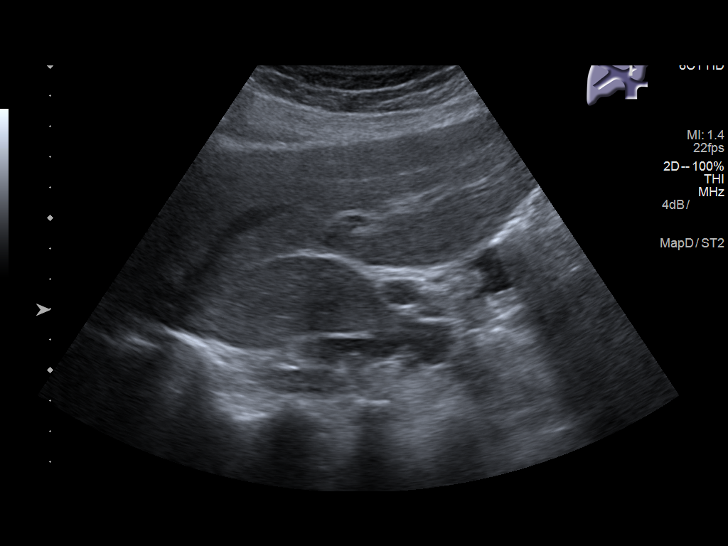
[im 30/48]
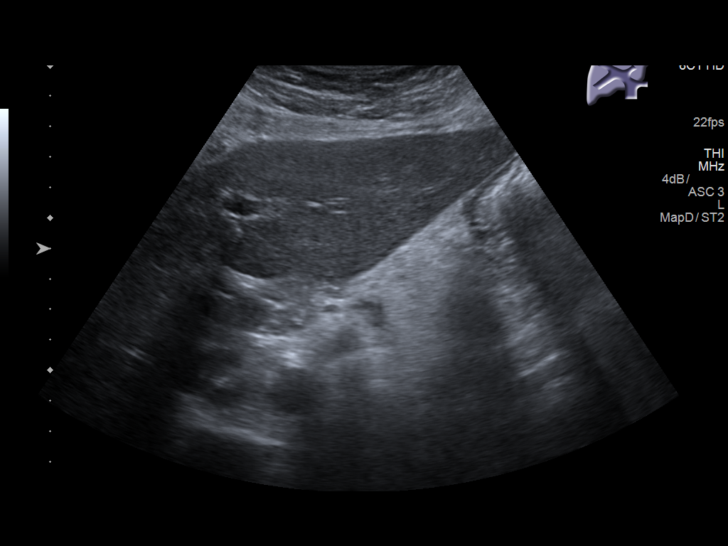
[im 32/48]
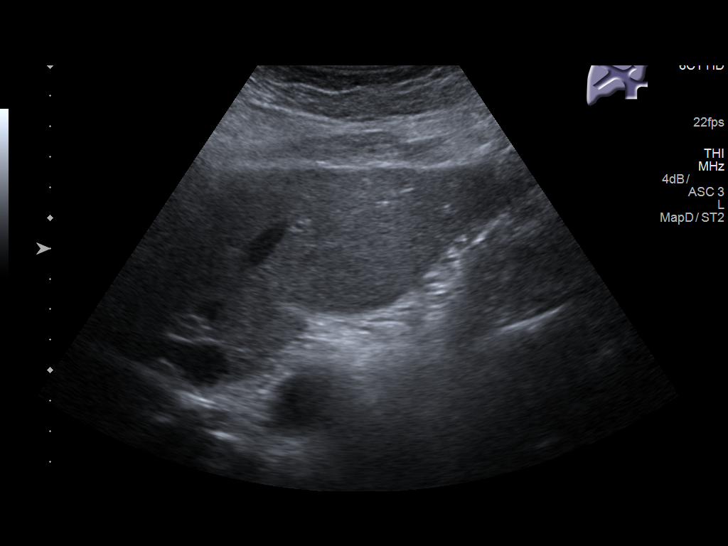
[im 36/48]
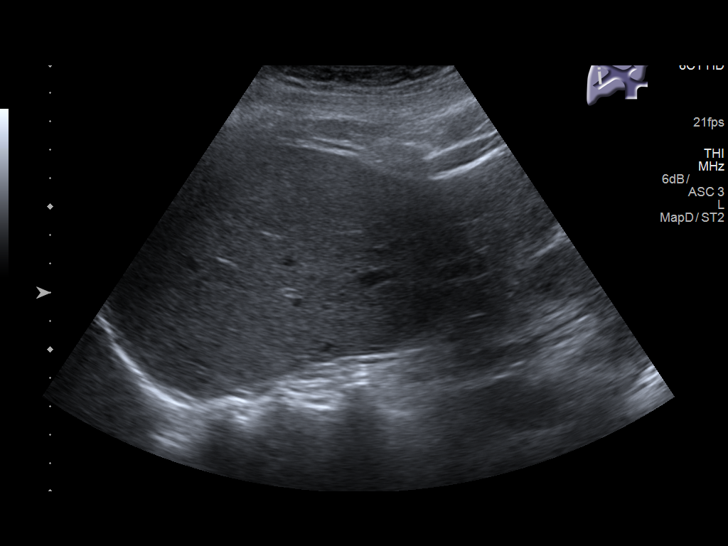
[im 40/48]
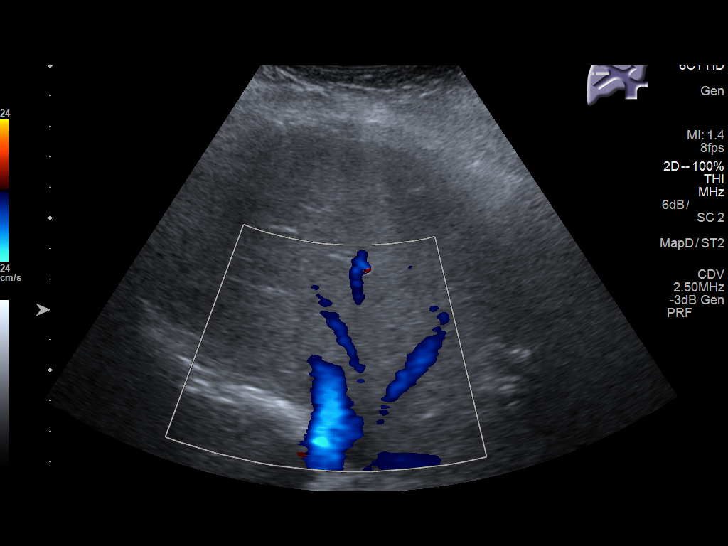
[im 44/48]
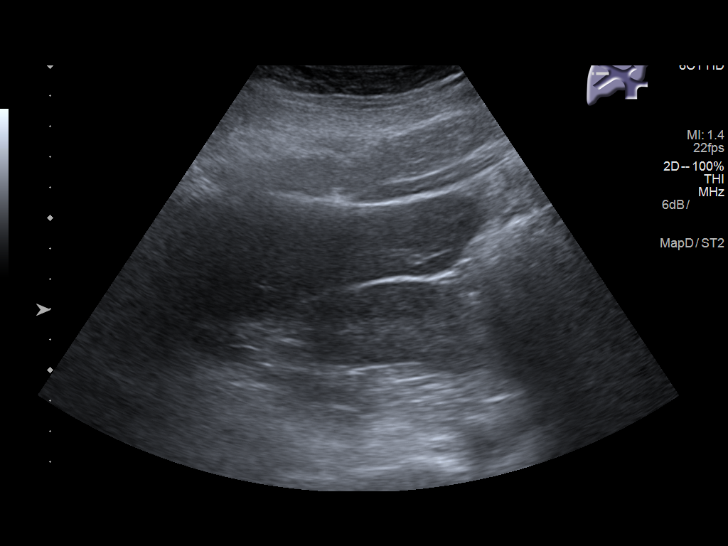
[im 48/48]
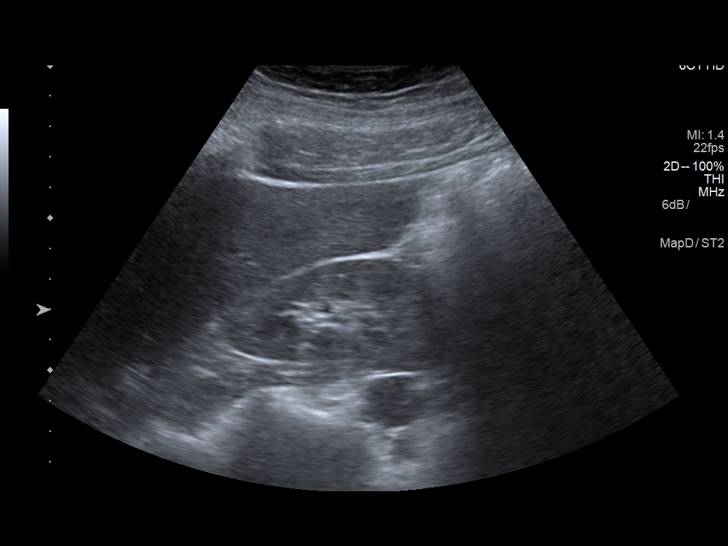

[14 of 25 positions shown; findings below may reference images not displayed]

FINDINGS: Gallbladder:

No gallstones or wall thickening visualized. No sonographic Murphy
sign noted by sonographer.

Common bile duct:

Diameter: 3 mm, normal.

Liver:

No focal lesion identified. Within normal limits in parenchymal
echogenicity. Portal vein is patent on color Doppler imaging with
normal direction of blood flow towards the liver.
IMPRESSION: Normal right upper quadrant ultrasound.

## 2017-12-03 ENCOUNTER — Ambulatory Visit: Payer: Commercial Managed Care - HMO | Admitting: Obstetrics and Gynecology

## 2017-12-07 ENCOUNTER — Ambulatory Visit: Payer: Medicare HMO | Admitting: Obstetrics and Gynecology

## 2017-12-16 DIAGNOSIS — C569 Malignant neoplasm of unspecified ovary: Secondary | ICD-10-CM | POA: Insufficient documentation

## 2018-04-15 DIAGNOSIS — F209 Schizophrenia, unspecified: Secondary | ICD-10-CM | POA: Diagnosis present

## 2018-05-04 IMAGING — CT CT ABD-PELV W/ CM
2 of 5 series · 15 of 46 positions shown, 17 images · IV contrast (APPLIED)
Comparison: 05/22/2016 ultrasound

CLINICAL DATA: Lower abdominal pain and vaginal bleeding for 2
weeks. Patient has not had menstrual cycles for years and is unsure
of cause.

EXAM:
CT ABDOMEN AND PELVIS WITH CONTRAST
TECHNIQUE: Multidetector CT imaging of the abdomen and pelvis was performed
using the standard protocol following bolus administration of
intravenous contrast.
CONTRAST:  100mL 1X6QNQ-FPP IOPAMIDOL (1X6QNQ-FPP) INJECTION 61%

[Series 2: routine abd/pel with · axial · 0.81mm/px · z∈[-846,-471]mm · 12 of 85 slices shown, 14 images]
[im 5/85  soft-tissue]
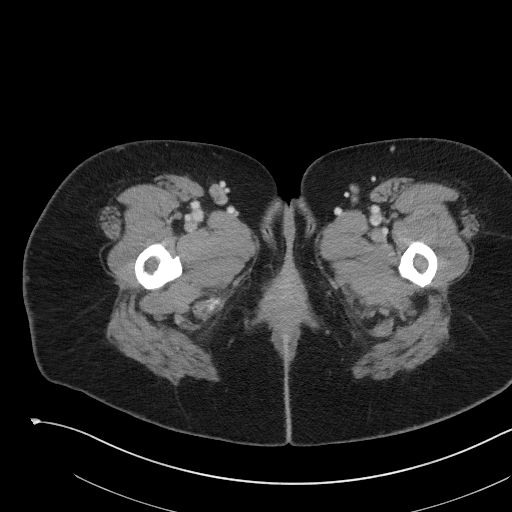
[im 5/85  bone]
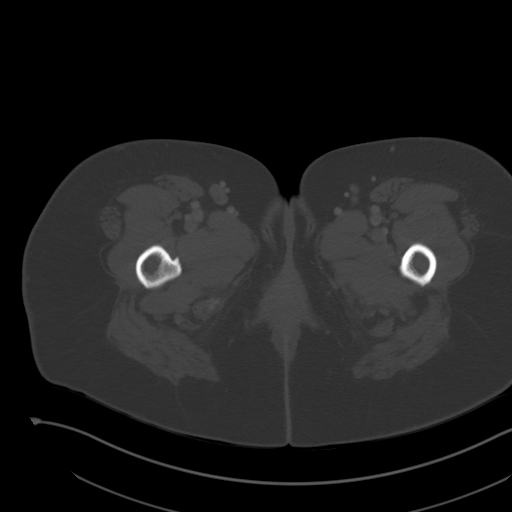
[im 13/85  soft-tissue]
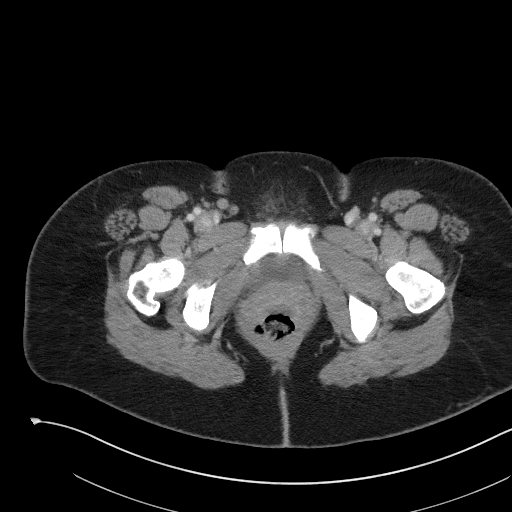
[im 17/85  soft-tissue]
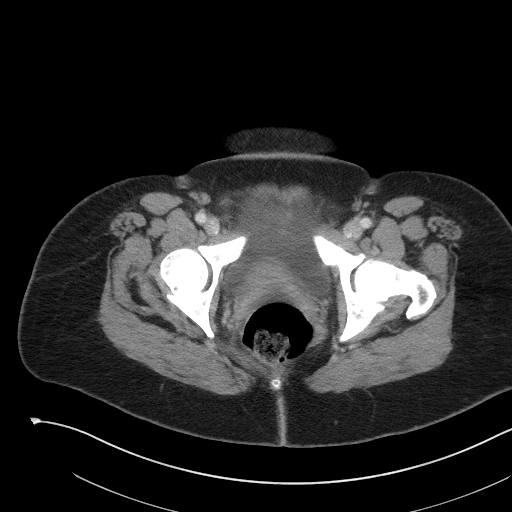
[im 26/85  soft-tissue]
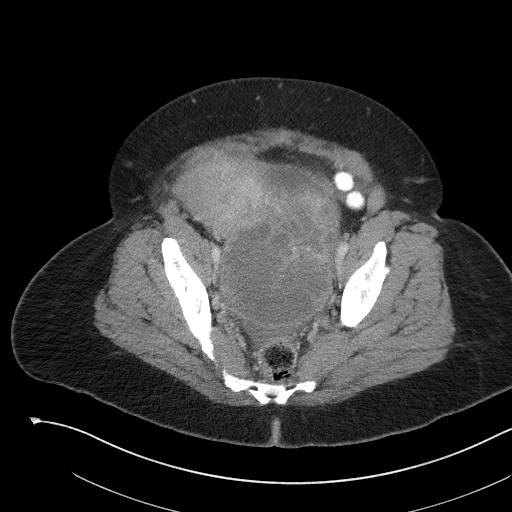
[im 34/85  soft-tissue]
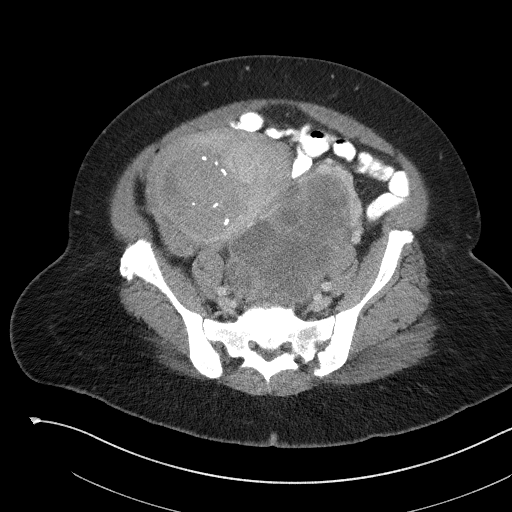
[im 38/85  soft-tissue]
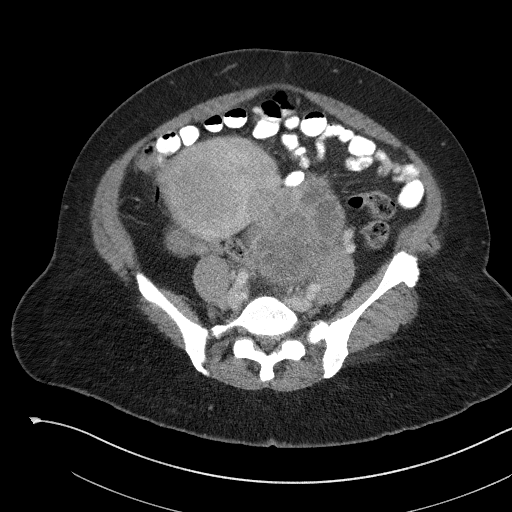
[im 47/85  soft-tissue]
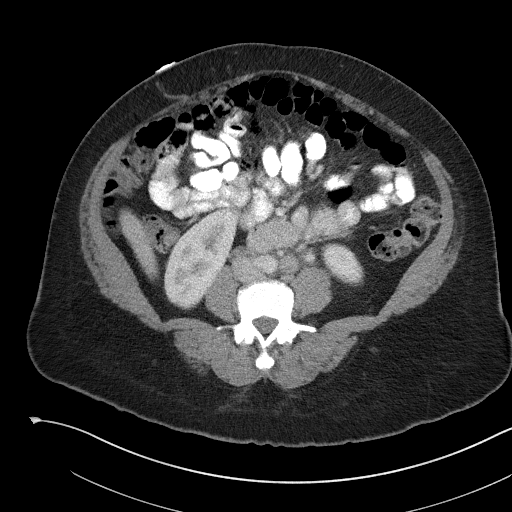
[im 51/85  soft-tissue]
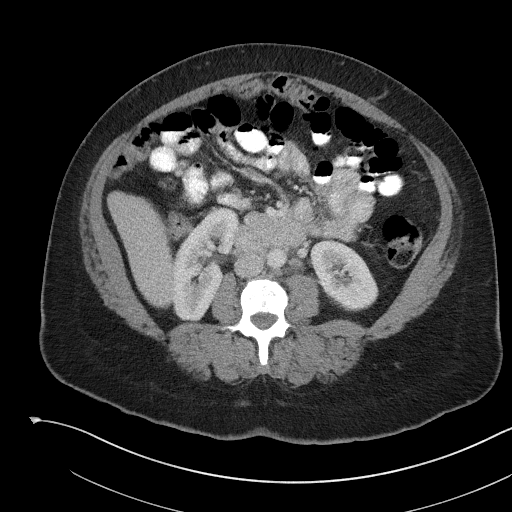
[im 59/85  soft-tissue]
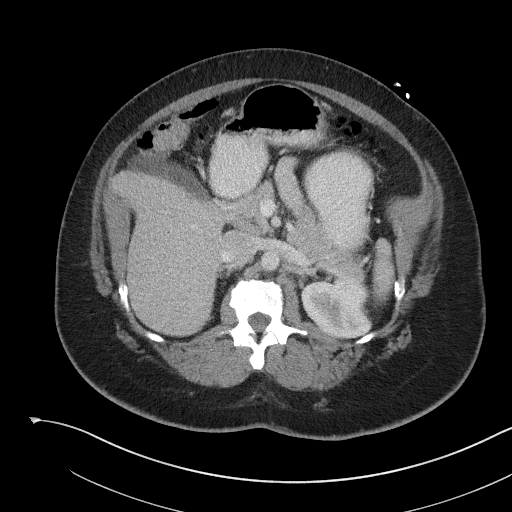
[im 59/85  bone]
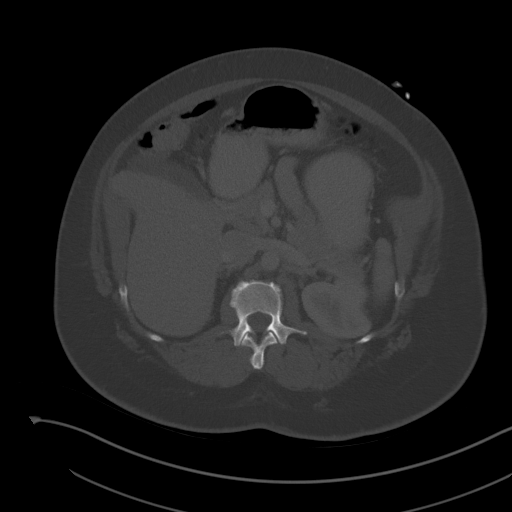
[im 68/85  soft-tissue]
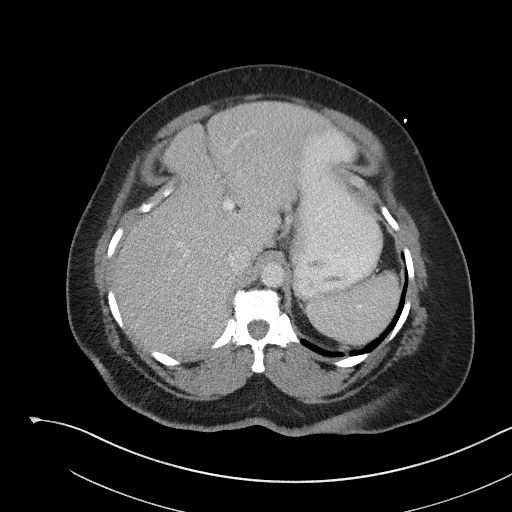
[im 72/85  soft-tissue]
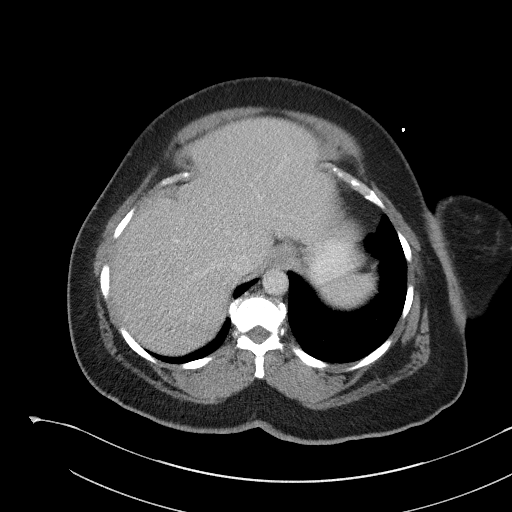
[im 80/85  soft-tissue]
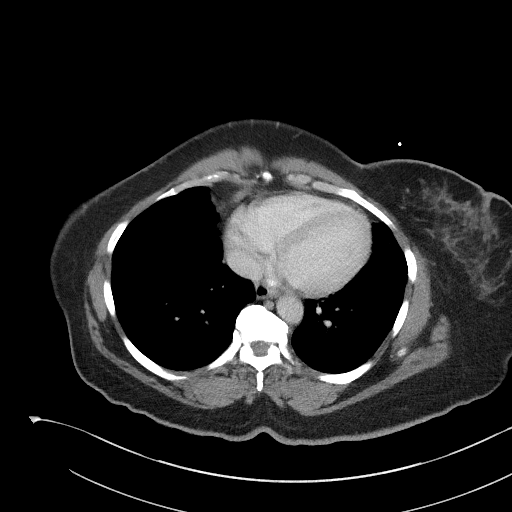

[Series 5: coronal st · coronal · 0.70mm/px · 3 of 99 slices shown]
[im 33/99  soft-tissue]
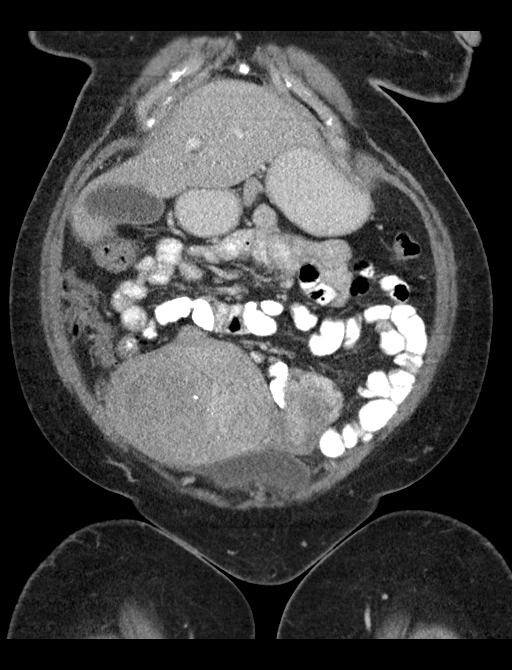
[im 44/99  soft-tissue]
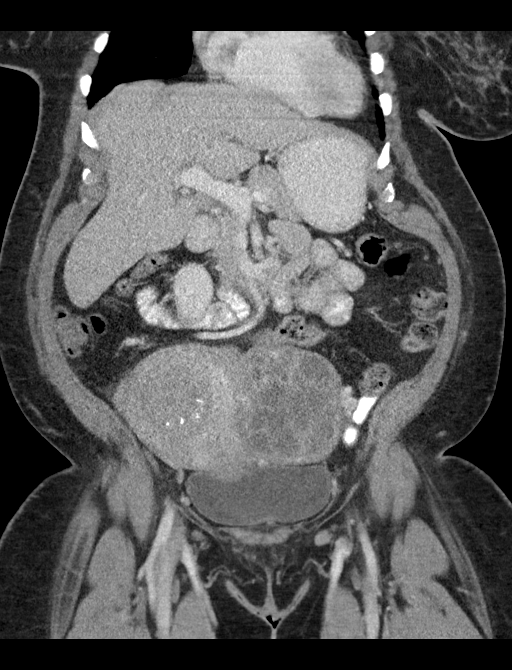
[im 55/99  soft-tissue]
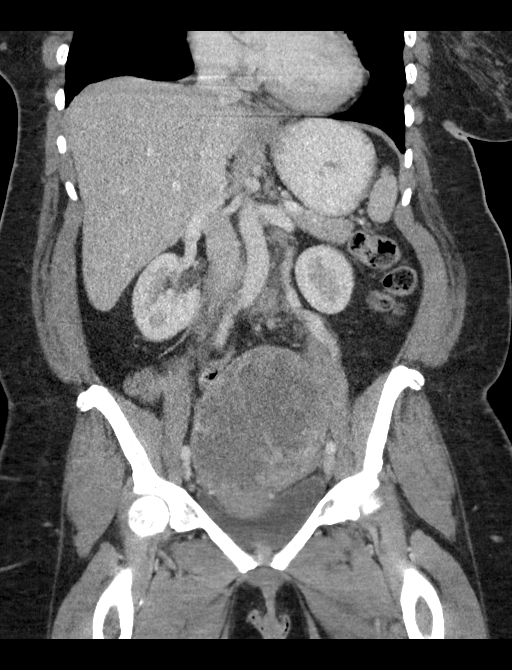

[15 of 46 positions shown; findings below may reference images not displayed]

FINDINGS: Lower chest: No acute abnormality.

Hepatobiliary: No focal liver abnormality is seen. No gallstones,
gallbladder wall thickening, or biliary dilatation.

Pancreas: Unremarkable. No pancreatic ductal dilatation or
surrounding inflammatory changes.

Spleen: Normal in size without focal abnormality.

Adrenals/Urinary Tract: Adrenal glands are unremarkable. Kidneys are
normal, without renal calculi, focal lesion, or hydronephrosis.
Bladder is unremarkable.

Stomach/Bowel: Stomach is within normal limits. Appendix appears
normal. No evidence of bowel wall thickening, distention, or
inflammatory changes.

Vascular/Lymphatic: No aortic aneurysm. Mildly enlarged
retroperitoneal, para-aortic lymph nodes the largest approximately
1.6 cm short axis at the bifurcation.

Reproductive: The uterus is markedly enlarged measuring 15.5 cm AP x
13.4 cm craniocaudad by 16.9 cm transverse. It demonstrates
heterogeneous enhancement predominantly solid and anterior on the
right and more hypodense cystic or necrotic posteriorly and on the
left. Speckled coarse calcifications are noted within the
right-sided uterine masses. Findings are likely related to numerous
leiomyomas however the possibility of leiomyosarcoma is of concern.
MRI is recommended for further evaluation. Neither ovary is
visualized.

Other: No abdominal wall hernia or abnormality. No abdominopelvic
ascites.

Musculoskeletal: No acute or significant osseous findings.
IMPRESSION: Markedly enlarged uterus containing what appear to be numerous
fibroids some of which are hypodense and necrotic in appearance
attention contributing to the patient's vaginal bleeding. MRI is
recommended for further assessment to exclude leiomyosarcoma.
Pertinent negatives with the no metastatic foci of the liver nor
visualized lung bases. There are small retroperitoneal lymph nodes
however noted measuring up to 1.5 cm.

## 2019-01-28 ENCOUNTER — Other Ambulatory Visit: Payer: Self-pay

## 2019-01-28 ENCOUNTER — Emergency Department
Admission: EM | Admit: 2019-01-28 | Discharge: 2019-01-28 | Disposition: A | Discharge status: Home / Self Care | Payer: Medicare HMO | Attending: Emergency Medicine | Admitting: Emergency Medicine

## 2019-01-28 DIAGNOSIS — T6591XA Toxic effect of unspecified substance, accidental (unintentional), initial encounter: Secondary | ICD-10-CM | POA: Diagnosis not present

## 2019-01-28 DIAGNOSIS — I10 Essential (primary) hypertension: Secondary | ICD-10-CM | POA: Diagnosis not present

## 2019-01-28 DIAGNOSIS — F17210 Nicotine dependence, cigarettes, uncomplicated: Secondary | ICD-10-CM | POA: Diagnosis not present

## 2019-01-28 DIAGNOSIS — H11423 Conjunctival edema, bilateral: Secondary | ICD-10-CM | POA: Diagnosis not present

## 2019-01-28 DIAGNOSIS — T7840XA Allergy, unspecified, initial encounter: Secondary | ICD-10-CM | POA: Diagnosis present

## 2019-01-28 DIAGNOSIS — Z79899 Other long term (current) drug therapy: Secondary | ICD-10-CM | POA: Insufficient documentation

## 2019-01-28 MED ORDER — PREDNISONE 20 MG PO TABS
40.0000 mg | ORAL_TABLET | Freq: Once | ORAL | Status: AC
Start: 2019-01-28 — End: 2019-01-28
  Administered 2019-01-28: 15:00:00 40 mg via ORAL
  Filled 2019-01-28: qty 2

## 2019-01-28 MED ORDER — PREDNISONE 20 MG PO TABS: 40.0000 mg | ORAL_TABLET | Freq: Every day | ORAL | 0 refills | Status: DC

## 2019-01-28 NOTE — ED Notes (Addendum)
EDP Quale at bedside. Swelling to both of pt's eyes. Able to open and close them. Eyes pink/red and pt tearing up some. Pt states she rubbed her eyes after using the hair dye and may have gotten some in her eyes. Irritation noted to pt's scalp. Pt to be brought to eye wash station. Visual acuity: 20/50 -- pt states does wear glasses at home but does not have them with her currently. States vision is slightly worse than normal per pt.

## 2019-01-28 NOTE — ED Triage Notes (Addendum)
Reports chemical/allergic reaction to hair dye used approx 3 days ago. Has not used same dye before. Bilateral eye swelling, left eye greater that right. Clear discharge coming from left. Burning sensation to scalp per patient. States that dye did not get in eyes, just fumes caused this pain/burning.  Pt alert and oriented X4, cooperative, RR even and unlabored, color WNL. Pt in NAD.  Took benadryl PTA

## 2019-01-28 NOTE — ED Provider Notes (Signed)
[facility] Emergency Department Provider Note  ____________________________________________   First MD Initiated Contact with Patient 01/28/19 1441     (approximate)  I have reviewed the triage vital signs and the nursing notes.  HISTORY  Chief Complaint Allergic Reaction  HPI Jessica Luna is a 63 y.o. female here for evaluation of allergic reaction  Patient reports that she put a new hair dye in her hair about 3 days ago, started to sting and burn after apply application.  It also her scalp has been red irritated and itchy.  She took Benadryl for today which helped some.  She reports that she noticed that she is having some clear discharge from her eyes, her eyes felt like they were burning after as she had rubbed her hands over her face and she has had swelling over her eyes.  No trouble with vision.  Still seeing clearly.  Reports of discomfort itching especially in the scalp and also swelling around both eyes.  No trouble breathing.  No swelling in the mouth lips or tongue.  No fevers or chills.  No exposure to Covid   Past Medical History:  Diagnosis Date  . Hypertension   . Schizophrenia [facility])     Patient Active Problem List   Diagnosis Date Noted  . Fibroid uterus 04/11/2017  . Lower abdominal pain 06/24/2016  . Postmenopausal bleeding 06/24/2016    Past Surgical History:  Procedure Laterality Date  . CESAREAN SECTION      Prior to Admission medications   Medication Sig Start Date End Date Taking? Authorizing Provider  amLODipine-benazepril (LOTREL) 10-20 MG capsule Take 1 capsule by mouth daily.  04/13/16   [provider]  omeprazole (PRILOSEC) 20 MG capsule Take 1 capsule (20 mg total) by mouth daily. 11/13/16   Schuyler Amor, MD  predniSONE (DELTASONE) 20 MG tablet Take 2 tablets (40 mg total) by mouth daily. 01/28/19   Delman Kitten, MD  RISPERDAL CONSTA 50 MG injection Inject 50 mg into the skin every 14 (fourteen) days.  11/10/16   [provider]  traMADol (ULTRAM) 50 MG tablet Take 1 tablet (50 mg total) by mouth every 6 (six) hours as needed (breakthrough pain). 06/24/16   Will Bonnet, MD    Allergies Codeine  Family History  Problem Relation Age of Onset  . Diabetes Father     Social History Social History   Tobacco Use  . Smoking status: Current Every Day Smoker  . Smokeless tobacco: Never Used  Substance Use Topics  . Alcohol use: Yes  . Drug use: Not on file    Review of Systems Constitutional: No fever/chills Eyes: No visual changes. ENT: No sore throat.  See HPI Cardiovascular: Denies chest pain. Respiratory: Denies shortness of breath. Skin: Negative for rash except for some swelling around her eyes and also redness and itching over her scalp. Neurological: Negative for headaches, areas of focal weakness or numbness.    ____________________________________________   PHYSICAL EXAM:  VITAL SIGNS: ED Triage Vitals  Enc Vitals Group     BP 01/28/19 1354 (!) 152/90     Pulse Rate 01/28/19 1354 97     Resp 01/28/19 1354 18     Temp 01/28/19 1354 98.7 F (37.1 C)     Temp Source 01/28/19 1354 Oral     SpO2 01/28/19 1354 98 %     Weight 01/28/19 1354 179 lb (81.2 kg)     Height 01/28/19 1354 5\' 1"  (1.549 m)  Head Circumference --      Peak Flow --      Pain Score 01/28/19 1403 9     Pain Loc --      Pain Edu? --      Excl. in [place]? --     Constitutional: Alert and oriented. Well appearing and in no acute distress. Eyes: Conjunctivae mildly injected with mild chemosis bilaterally.  No purulence exudates.  There is mild periorbital edema without surrounding erythema or proptosis.  Normal extraocular movements without pain. Head: Atraumatic except she has what appears to be a slightly irritated or friable almost first-degree burn appearance to the scalp underlying her hairline. Nose: No congestion/rhinnorhea. Mouth/Throat: Mucous membranes are moist. Neck:  No stridor.  Cardiovascular: Normal rate, regular rhythm. Grossly normal heart sounds.  Good peripheral circulation. Respiratory: Normal respiratory effort.  No retractions. Lungs CTAB.  Neurologic:  Normal speech and language. No gross focal neurologic deficits are appreciated.  Skin:  Skin is warm, dry and intact. No rash noted. Psychiatric: Mood and affect are normal. Speech and behavior are normal.  ____________________________________________   LABS (all labs ordered are listed, but only abnormal results are displayed)  Labs Reviewed - No data to display ____________________________________________  EKG   ____________________________________________  RADIOLOGY   ____________________________________________   PROCEDURES  Procedure(s) performed: None  Procedures  Critical Care performed: No  ____________________________________________   INITIAL IMPRESSION / ASSESSMENT AND PLAN / ED COURSE  Pertinent labs & imaging results that were available during my care of the patient were reviewed by me and considered in my medical decision making (see chart for details).   Examination appears to demonstrate contact dermatitis or slight chemical burn.  Appears to be consistent with use of her hair product and likely rub some into her eyes around her eyes after use.  She took Benadryl with some relief, mild to moderate periorbital swelling and mild to moderate conjunctival injection.  Denies visual changes.  Reassuring clinical examination except for what appears to be a dermatitis-like reaction.  Mild blepharitis.  Does not appear to be indication of skin sloughing, severe reaction, or infection.  Given the nature of her symptoms, will have the patient utilize eyewash station here and treat with short course of prednisone.  Over-the-counter Benadryl.  Return precautions and treatment recommendations and follow-up discussed with the patient who is agreeable with the plan.        ____________________________________________   FINAL CLINICAL IMPRESSION(S) / ED DIAGNOSES  Final diagnoses:  Chemosis of conjunctiva of both eyes  Allergic reaction to chemical substance, accidental or unintentional, initial encounter        Note:  This document was prepared using Dragon voice recognition software and may include unintentional dictation errors       Delman Kitten, MD 01/28/19 1511

## 2019-01-28 NOTE — ED Notes (Signed)
Pt completed about 32mins of washing out eyes at station after education. Pt couldn't continue to get full 69mins. Refused even after explanation.

## 2021-08-05 ENCOUNTER — Other Ambulatory Visit: Payer: Self-pay | Admitting: Internal Medicine

## 2021-08-05 DIAGNOSIS — I10 Essential (primary) hypertension: Secondary | ICD-10-CM | POA: Diagnosis not present

## 2021-08-05 DIAGNOSIS — Z1231 Encounter for screening mammogram for malignant neoplasm of breast: Secondary | ICD-10-CM

## 2021-08-05 DIAGNOSIS — R7309 Other abnormal glucose: Secondary | ICD-10-CM | POA: Diagnosis not present

## 2021-08-05 DIAGNOSIS — F17200 Nicotine dependence, unspecified, uncomplicated: Secondary | ICD-10-CM | POA: Diagnosis not present

## 2021-08-05 DIAGNOSIS — Z0000 Encounter for general adult medical examination without abnormal findings: Secondary | ICD-10-CM | POA: Diagnosis not present

## 2021-08-21 DIAGNOSIS — I10 Essential (primary) hypertension: Secondary | ICD-10-CM | POA: Diagnosis not present

## 2021-11-19 ENCOUNTER — Emergency Department
Admission: EM | Admit: 2021-11-19 | Discharge: 2021-11-19 | Disposition: A | Discharge status: Home / Self Care | Payer: 59 | Attending: Emergency Medicine | Admitting: Emergency Medicine

## 2021-11-19 DIAGNOSIS — R21 Rash and other nonspecific skin eruption: Secondary | ICD-10-CM | POA: Diagnosis present

## 2021-11-19 DIAGNOSIS — R300 Dysuria: Secondary | ICD-10-CM | POA: Diagnosis not present

## 2021-11-19 DIAGNOSIS — L2089 Other atopic dermatitis: Secondary | ICD-10-CM | POA: Insufficient documentation

## 2021-11-19 DIAGNOSIS — L0100 Impetigo, unspecified: Secondary | ICD-10-CM | POA: Diagnosis not present

## 2021-11-19 DIAGNOSIS — L209 Atopic dermatitis, unspecified: Secondary | ICD-10-CM

## 2021-11-19 DIAGNOSIS — B372 Candidiasis of skin and nail: Secondary | ICD-10-CM | POA: Insufficient documentation

## 2021-11-19 DIAGNOSIS — R32 Unspecified urinary incontinence: Secondary | ICD-10-CM | POA: Diagnosis not present

## 2021-11-19 LAB — URINALYSIS, ROUTINE W REFLEX MICROSCOPIC
Bacteria, UA: NONE SEEN
Bilirubin Urine: NEGATIVE
Glucose, UA: NEGATIVE mg/dL
Ketones, ur: NEGATIVE mg/dL
Leukocytes,Ua: NEGATIVE
Nitrite: NEGATIVE
Protein, ur: NEGATIVE mg/dL
Specific Gravity, Urine: 1.001 — ABNORMAL LOW (ref 1.005–1.030)
pH: 7 (ref 5.0–8.0)

## 2021-11-19 MED ORDER — MUPIROCIN 2 % EX OINT: 1.0000 | TOPICAL_OINTMENT | Freq: Two times a day (BID) | CUTANEOUS | 0 refills | Status: DC

## 2021-11-19 MED ORDER — CEPHALEXIN 500 MG PO CAPS: 500.0000 mg | ORAL_CAPSULE | Freq: Three times a day (TID) | ORAL | 0 refills | Status: AC

## 2021-11-19 MED ORDER — RISPERIDONE 1 MG PO TABS
2.0000 mg | ORAL_TABLET | Freq: Once | ORAL | Status: AC
  Administered 2021-11-19: 2 mg via ORAL
  Filled 2021-11-19: qty 2

## 2021-11-19 MED ORDER — RISPERIDONE 1 MG PO TABS: 1.0000 mg | ORAL_TABLET | Freq: Once | ORAL | Status: DC

## 2021-11-19 MED ORDER — FLUOCINOLONE ACETONIDE SCALP 0.01 % EX OIL: 1.0000 | TOPICAL_OIL | Freq: Every day | CUTANEOUS | 0 refills | Status: AC

## 2021-11-19 MED ORDER — NYSTATIN 100000 UNIT/GM EX POWD: 1.0000 | Freq: Three times a day (TID) | CUTANEOUS | 0 refills | Status: DC

## 2021-11-19 NOTE — Discharge Instructions (Signed)
For your scalp:  Apply the fluocinolone scalp oil 1-2x daily to the scalp for 1 week or until healed.  Apply the MUPIROCIN ointment to the back of the neck and areas of infection.  Take the oral antibiotic as prescribed.  For your rash in groin:  Apply the Nystatin powder 2-3x daily  ASK YOUR ACT TEAM TO SEE YOU TOMORROW TO ADMINISTER YOUR SHOT

## 2021-11-19 NOTE — ED Triage Notes (Addendum)
Patient reports burning and polyuria x 1 week; Patient does have urinary incontinence (now a new issue, according to her this has been a issue for 5 years) and smells strongly of urine during triage  Patient c/o itchy rash across the back of her neck (at her hairline) after she had "contact with a man"
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
## 2021-11-21 LAB — URINE CULTURE

## 2022-06-24 DIAGNOSIS — H40032 Anatomical narrow angle, left eye: Secondary | ICD-10-CM | POA: Diagnosis not present

## 2022-06-24 DIAGNOSIS — H40031 Anatomical narrow angle, right eye: Secondary | ICD-10-CM | POA: Diagnosis not present

## 2022-06-24 DIAGNOSIS — H40009 Preglaucoma, unspecified, unspecified eye: Secondary | ICD-10-CM | POA: Diagnosis not present

## 2022-06-24 DIAGNOSIS — H40033 Anatomical narrow angle, bilateral: Secondary | ICD-10-CM | POA: Diagnosis not present

## 2022-07-01 DIAGNOSIS — R3981 Functional urinary incontinence: Secondary | ICD-10-CM | POA: Diagnosis not present

## 2022-07-01 DIAGNOSIS — F209 Schizophrenia, unspecified: Secondary | ICD-10-CM | POA: Diagnosis not present

## 2022-07-01 DIAGNOSIS — I10 Essential (primary) hypertension: Secondary | ICD-10-CM | POA: Diagnosis not present

## 2022-07-17 ENCOUNTER — Other Ambulatory Visit: Payer: Self-pay | Admitting: Internal Medicine

## 2022-07-17 ENCOUNTER — Other Ambulatory Visit: Payer: Self-pay | Admitting: Family

## 2022-09-19 ENCOUNTER — Other Ambulatory Visit: Payer: Self-pay

## 2022-09-19 ENCOUNTER — Emergency Department
Admission: EM | Admit: 2022-09-19 | Discharge: 2022-09-22 | Disposition: A | Discharge status: Other Acute Inpatient Hospital | Payer: 59 | Attending: Student in an Organized Health Care Education/Training Program | Admitting: Student in an Organized Health Care Education/Training Program

## 2022-09-19 DIAGNOSIS — F29 Unspecified psychosis not due to a substance or known physiological condition: Secondary | ICD-10-CM | POA: Insufficient documentation

## 2022-09-19 DIAGNOSIS — Y901 Blood alcohol level of 20-39 mg/100 ml: Secondary | ICD-10-CM | POA: Diagnosis not present

## 2022-09-19 DIAGNOSIS — Z79899 Other long term (current) drug therapy: Secondary | ICD-10-CM | POA: Insufficient documentation

## 2022-09-19 DIAGNOSIS — R461 Bizarre personal appearance: Secondary | ICD-10-CM | POA: Diagnosis present

## 2022-09-19 LAB — CBC WITH DIFFERENTIAL/PLATELET
Abs Immature Granulocytes: 0.02 K/uL (ref 0.00–0.07)
Basophils Absolute: 0 K/uL (ref 0.0–0.1)
Basophils Relative: 0 %
Eosinophils Absolute: 0.4 K/uL (ref 0.0–0.5)
Eosinophils Relative: 7 %
HCT: 37.5 % (ref 36.0–46.0)
Hemoglobin: 12.2 g/dL (ref 12.0–15.0)
Immature Granulocytes: 0 %
Lymphocytes Relative: 38 %
Lymphs Abs: 2.4 K/uL (ref 0.7–4.0)
MCH: 30.7 pg (ref 26.0–34.0)
MCHC: 32.5 g/dL (ref 30.0–36.0)
MCV: 94.5 fL (ref 80.0–100.0)
Monocytes Absolute: 0.7 K/uL (ref 0.1–1.0)
Monocytes Relative: 11 %
Neutro Abs: 2.8 K/uL (ref 1.7–7.7)
Neutrophils Relative %: 44 %
Platelets: 137 K/uL — ABNORMAL LOW (ref 150–400)
RBC: 3.97 MIL/uL (ref 3.87–5.11)
RDW: 13.1 % (ref 11.5–15.5)
WBC: 6.3 K/uL (ref 4.0–10.5)
nRBC: 0 % (ref 0.0–0.2)

## 2022-09-19 LAB — COMPREHENSIVE METABOLIC PANEL WITH GFR
ALT: 77 U/L — ABNORMAL HIGH (ref 0–44)
AST: 120 U/L — ABNORMAL HIGH (ref 15–41)
Albumin: 3.1 g/dL — ABNORMAL LOW (ref 3.5–5.0)
Alkaline Phosphatase: 120 U/L (ref 38–126)
Anion gap: 9 (ref 5–15)
BUN: 11 mg/dL (ref 8–23)
CO2: 25 mmol/L (ref 22–32)
Calcium: 8.3 mg/dL — ABNORMAL LOW (ref 8.9–10.3)
Chloride: 99 mmol/L (ref 98–111)
Creatinine, Ser: 1.03 mg/dL — ABNORMAL HIGH (ref 0.44–1.00)
GFR, Estimated: >60 mL/min
Glucose, Bld: 208 mg/dL — ABNORMAL HIGH (ref 70–99)
Potassium: 3.8 mmol/L (ref 3.5–5.1)
Sodium: 133 mmol/L — ABNORMAL LOW (ref 135–145)
Total Bilirubin: 0.7 mg/dL (ref 0.3–1.2)
Total Protein: 6.9 g/dL (ref 6.5–8.1)

## 2022-09-19 LAB — ETHANOL: Alcohol, Ethyl (B): 24 mg/dL — ABNORMAL HIGH

## 2022-09-19 MED ORDER — BACITRACIN ZINC 500 UNIT/GM EX OINT
TOPICAL_OINTMENT | Freq: Once | CUTANEOUS | Status: AC
  Administered 2022-09-19: 1 via TOPICAL
  Filled 2022-09-19: qty 0.9

## 2022-09-19 MED ORDER — POLYMYXIN B-TRIMETHOPRIM 10000-0.1 UNIT/ML-% OP SOLN
2.0000 [drp] | Freq: Four times a day (QID) | OPHTHALMIC | Status: DC
  Administered 2022-09-19 – 2022-09-22 (×9): 2 [drp] via OPHTHALMIC
  Filled 2022-09-19 (×2): qty 10

## 2022-09-19 NOTE — ED Notes (Signed)
Pt ambulatory to the triage bathroom to obtain urine sample and for a brief change. Clean brief applied, secured with hospital provided mesh underwear. Pt unable to provide urine sample at this time.
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
## 2022-09-19 NOTE — ED Notes (Signed)
IVC 

## 2022-09-19 NOTE — ED Notes (Signed)
Pt dressed out into hospital provided psych pt scrubs by Jae Dire, RN, an this EDT. All of pts belongings placed in labeled pt belonging bag. Pts belongings include: X1 black and blue shorts X1 tie die tank top shirt X1 brown hair ties X1 black cell phone with no significant cracks in the screen at this time X1 brown necklace and pair of gray and pink stud earrings placed in labeled specimen cup within pts belongings bag X1 pair of brown slide sandals

## 2022-09-19 NOTE — ED Notes (Signed)
Pts daughter Idora Gideon called to inquire about pts status. Per pt ok for RN to give medical information to daughter. Daughter requesting psych team to call pts [facility] team for more information. This RN agreed to pass message along to TTS.
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
## 2022-09-19 NOTE — ED Notes (Signed)
Pt provided snack and water 
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
## 2022-09-20 DIAGNOSIS — F29 Unspecified psychosis not due to a substance or known physiological condition: Secondary | ICD-10-CM | POA: Diagnosis not present

## 2022-09-20 LAB — URINALYSIS, ROUTINE W REFLEX MICROSCOPIC
Bacteria, UA: NONE SEEN
Bilirubin Urine: NEGATIVE
Glucose, UA: NEGATIVE mg/dL
Ketones, ur: NEGATIVE mg/dL
Nitrite: NEGATIVE
Protein, ur: NEGATIVE mg/dL
Specific Gravity, Urine: 1.017 (ref 1.005–1.030)
Squamous Epithelial / HPF: NONE SEEN /HPF (ref 0–5)
pH: 7 (ref 5.0–8.0)

## 2022-09-20 LAB — URINE DRUG SCREEN, QUALITATIVE (ARMC ONLY)
Amphetamines, Ur Screen: NOT DETECTED
Barbiturates, Ur Screen: NOT DETECTED
Benzodiazepine, Ur Scrn: NOT DETECTED
Cannabinoid 50 Ng, Ur ~~LOC~~: NOT DETECTED
Cocaine Metabolite,Ur ~~LOC~~: NOT DETECTED
MDMA (Ecstasy)Ur Screen: NOT DETECTED
Methadone Scn, Ur: NOT DETECTED
Opiate, Ur Screen: NOT DETECTED
Phencyclidine (PCP) Ur S: NOT DETECTED
Tricyclic, Ur Screen: NOT DETECTED

## 2022-09-20 MED ORDER — NICOTINE 14 MG/24HR TD PT24
14.0000 mg | MEDICATED_PATCH | Freq: Once | TRANSDERMAL | Status: AC
  Administered 2022-09-20: 14 mg via TRANSDERMAL
  Filled 2022-09-20: qty 1

## 2022-09-20 MED ORDER — NAPROXEN 500 MG PO TABS
500.0000 mg | ORAL_TABLET | Freq: Once | ORAL | Status: AC
  Administered 2022-09-20: 500 mg via ORAL
  Filled 2022-09-20: qty 1

## 2022-09-20 MED ORDER — TETRACAINE HCL 0.5 % OP SOLN
1.0000 [drp] | Freq: Once | OPHTHALMIC | Status: AC
  Administered 2022-09-20: 1 [drp] via OPHTHALMIC
  Filled 2022-09-20: qty 4

## 2022-09-20 MED ORDER — DIPHENHYDRAMINE HCL 25 MG PO CAPS
50.0000 mg | ORAL_CAPSULE | Freq: Once | ORAL | Status: AC
  Administered 2022-09-20: 50 mg via ORAL
  Filled 2022-09-20: qty 2

## 2022-09-20 NOTE — ED Notes (Signed)
Pt given graham crackers, peanut butter, and cola 

## 2022-09-20 NOTE — ED Notes (Addendum)
Attempted to collect u/a sample at this time by using a hat in the toilet. Pt is incontinent and was able to give enough for a sample.
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
## 2022-09-20 NOTE — BH Assessment (Signed)
Patient has been accepted to [facility].  Accepting physician is Dr. Tyrone Apple.  Call report to [phone] or Pager # [phone].  Representative was [employer].   ER Staff is aware of it:  Melody, ER Secretary  Dr. Arnoldo Morale, ER MD  Rosalie Doctor, Patient's Nurse     Patient can arrive to facility on Monday 09/22/22 after 8 AM.

## 2022-09-20 NOTE — ED Notes (Signed)
Pt upset that she hasn't seen a psych provider. I made her aware that there was a not placed last night by a psych provider. Pt insists that she hasn't seen one and wants to see one so that she can be discharged. Made her aware that the provider would be here tonight and that she could speak to them then. Pt okay with this.

## 2022-09-20 NOTE — ED Notes (Signed)
Daughter came to visit the pt and was given an update before she left.

## 2022-09-20 NOTE — ED Notes (Signed)
After giving pt medication, this nurse helped the pt wash the pt's hair w/ washcloths since the pt's scalp was irritated. Pt states her scalp feels a lot better. Bed linen and the pt's blue top was changed at this time. Pt was given ice packs to place on pt's eye and scalp.

## 2022-09-20 NOTE — ED Notes (Signed)
2nd liter of LR initiated via morgan lens per Dr. Katrinka Blazing orders.

## 2022-09-20 NOTE — ED Provider Notes (Signed)
-----------------------------------------   8:03 AM on 09/20/2022 -----------------------------------------  Vitals:   09/19/22 1852 09/20/22 0415  BP: (!) 184/105 (!) 162/80  Pulse: (!) 105 86  Resp: 17 16  Temp: 99.1 F (37.3 C)   SpO2: 99% 95%     Patient is resting comfortably at this time.  She had eye irrigation performed, and is now resting without distress.    Current plan is for psychiatric inpatient admission.  She is under IVC   Sharyn Creamer, MD 09/20/22 [phone]

## 2022-09-20 NOTE — BH Assessment (Signed)
Per [facility] AC Alcario Drought), patient to be referred out of system.  Referral information for Psychiatric Hospitalization faxed to;   [facility] [phone]),   [facility] [phone]) [phone]  [facility] [phone]),  [address] [phone]),   [facility] [phone] -or- [phone]),   [facility] [phone]),  [facility] [phone] or [phone]),   [facility] [phone])

## 2022-09-21 DIAGNOSIS — F29 Unspecified psychosis not due to a substance or known physiological condition: Secondary | ICD-10-CM | POA: Diagnosis not present

## 2022-09-21 MED ORDER — NICOTINE 14 MG/24HR TD PT24
14.0000 mg | MEDICATED_PATCH | Freq: Once | TRANSDERMAL | Status: AC
  Administered 2022-09-21: 14 mg via TRANSDERMAL
  Filled 2022-09-21: qty 1

## 2022-09-21 NOTE — ED Notes (Signed)
This tech obtained vitals on pt.  

## 2022-09-21 NOTE — ED Notes (Addendum)
Breakfast tray given. °

## 2022-09-21 NOTE — ED Notes (Signed)
Pt is inc. Pt state, "I don't know when I have to pee."

## 2022-09-21 NOTE — ED Notes (Signed)
Pt wanting to talk to psych regarding status. Rashaun NP made aware

## 2022-09-21 NOTE — ED Notes (Signed)
Patient has visitor at bedside during allotted visiting hours.
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
## 2022-09-21 NOTE — ED Notes (Signed)
Pt again asking about status. Pt was informed that she was being admitted to [facility]. Pt wanting to speak to psych team because she says she is "mentally well"

## 2022-09-21 NOTE — ED Notes (Signed)
Hospital meal provided, pt did not want her food at this time.

## 2022-09-21 NOTE — ED Notes (Signed)
Pt was taken to the restroom via wheelchair. Complete wipe bath given and new [facility] scrubs provided. Pt was laying in bed with a saturated brief on from night time. Dry brief applied. Linen change provided. Pt is back in bed. No other needs found at this moment.

## 2022-09-21 NOTE — ED Notes (Signed)
IVC/pt accepted to [facility] 7/8/ after 8AM

## 2022-09-21 NOTE — ED Notes (Signed)
Pt requested multiple times to speak with psych NP regarding inpatient status and transfer to inpatient facility. Rashaun, NP messaged at [phone], paged at [phone], and [phone] without answer or acknowledgement of secure chat/pages. This Clinical research associate previously spoke with TTS regarding patient requests, NP still did not come to see patient despite being notified by TTS of patient request to speak with her. AC notified, attempted to call [facility] but no answer. Primary RN Rosalie Doctor made aware.

## 2022-09-21 NOTE — ED Notes (Signed)
Changed patients bedding. Gave patient new clothes and wipes to clean herself.

## 2022-09-22 NOTE — ED Notes (Signed)
Pt provided lunch tray and water

## 2022-09-22 NOTE — ED Notes (Signed)
Breakfast tray and juice provided 
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
## 2022-09-22 NOTE — ED Notes (Signed)
[place]  [facility]  called  for t ransport  to [facility] °

## 2022-09-22 NOTE — BH Assessment (Signed)
Patient was seen by this provider, because it was conveyed that she wanted to know why she was made inpatient.  On approach patient is laying in hall bed.  This provider introduces herself again and explains to the patient why she was ivc'd and why the decision to make her inpatient was made.  She accepted the reasoning.  Patient is still unable to state correct year and denies walking in and out of traffic.   Patient pleasantly responds and states that she is nervous because she was moved from a room to a hall bed which has made her feel uncomfortable. She also stated that she's incontinent and don't always know when she has to use the bathroom which adds to her anxiety about being in the hall bed. I apologized and empathized with the patient.  She further explains that her nurse has put her here on purpose and has not been coming to her house like she's supposed to, to ensure she takes her injection or other medications.  Her main concern was trying to find  away to get her [facility] nurse changed going forward.  Explained to patient that she would be transferred to [facility] most likely tomorrow and that she can speak with a Child psychotherapist while there and that they may be able to assist with this matter.  She was appreciative.

## 2024-01-04 ENCOUNTER — Other Ambulatory Visit: Payer: Self-pay | Admitting: Internal Medicine

## 2024-01-04 DIAGNOSIS — Z0000 Encounter for general adult medical examination without abnormal findings: Secondary | ICD-10-CM

## 2024-01-04 DIAGNOSIS — F17210 Nicotine dependence, cigarettes, uncomplicated: Secondary | ICD-10-CM

## 2024-01-07 ENCOUNTER — Other Ambulatory Visit: Payer: Self-pay | Admitting: Internal Medicine

## 2024-01-07 DIAGNOSIS — Z1231 Encounter for screening mammogram for malignant neoplasm of breast: Secondary | ICD-10-CM

## 2024-01-13 ENCOUNTER — Ambulatory Visit: Attending: Internal Medicine

## 2024-02-24 ENCOUNTER — Emergency Department
Admission: EM | Admit: 2024-02-24 | Discharge: 2024-02-24 | Disposition: A | Discharge status: Home / Self Care | Attending: Emergency Medicine | Admitting: Emergency Medicine

## 2024-02-24 ENCOUNTER — Emergency Department

## 2024-02-24 ENCOUNTER — Other Ambulatory Visit: Payer: Self-pay

## 2024-02-24 ENCOUNTER — Encounter: Payer: Self-pay | Admitting: Emergency Medicine

## 2024-02-24 DIAGNOSIS — M25551 Pain in right hip: Secondary | ICD-10-CM | POA: Insufficient documentation

## 2024-02-24 DIAGNOSIS — N3001 Acute cystitis with hematuria: Secondary | ICD-10-CM | POA: Insufficient documentation

## 2024-02-24 LAB — URINALYSIS, ROUTINE W REFLEX MICROSCOPIC
Bilirubin Urine: NEGATIVE
Glucose, UA: NEGATIVE mg/dL
Ketones, ur: NEGATIVE mg/dL
Leukocytes,Ua: NEGATIVE
Nitrite: NEGATIVE
Protein, ur: NEGATIVE mg/dL
Specific Gravity, Urine: 1.026 (ref 1.005–1.030)
pH: 5 (ref 5.0–8.0)

## 2024-02-24 MED ORDER — KETOROLAC TROMETHAMINE 15 MG/ML IJ SOLN: 15.0000 mg | Freq: Once | INTRAMUSCULAR | Status: DC

## 2024-02-24 MED ORDER — IBUPROFEN 600 MG PO TABS
600.0000 mg | ORAL_TABLET | Freq: Once | ORAL | Status: AC
  Administered 2024-02-24: 600 mg via ORAL
  Filled 2024-02-24: qty 1

## 2024-02-24 MED ORDER — LIDOCAINE 5 % EX PTCH
1.0000 | MEDICATED_PATCH | CUTANEOUS | Status: DC
  Administered 2024-02-24: 1 via TRANSDERMAL
  Filled 2024-02-24: qty 1

## 2024-02-24 MED ORDER — CEPHALEXIN 500 MG PO CAPS: 500.0000 mg | ORAL_CAPSULE | Freq: Four times a day (QID) | ORAL | 0 refills | Status: AC

## 2024-02-24 MED ORDER — ACETAMINOPHEN 325 MG PO TABS
650.0000 mg | ORAL_TABLET | Freq: Once | ORAL | Status: AC
  Administered 2024-02-24: 650 mg via ORAL
  Filled 2024-02-24: qty 2

## 2024-02-24 NOTE — ED Triage Notes (Signed)
 Per EMS pt coming from home c/o right hip pain. Patient was laying in bed on arrival. Ems assisted patient down the stairs onto stretcher. Denies any injury.

## 2024-02-24 NOTE — ED Provider Notes (Signed)
 [facility] Provider Note    Event Date/Time   First MD Initiated Contact with Patient 02/24/24 1210     (approximate)   History   Leg Pain   HPI  Jessica Luna is a 68 y.o. female who presents today for evaluation of right hip pain.  Patient reports that she is able to stand and move her legs but it hurts when she does.  She also reports that she has had some urinary urgency.  No abdominal pain.  No fevers or chills.  No back pain.  Denies urinary fecal incontinence or retention.  No nausea or vomiting.  Patient Active Problem List   Diagnosis Date Noted   Psychosis (HCC) 09/19/2022   Fibroid uterus 04/11/2017   Lower abdominal pain 06/24/2016   Postmenopausal bleeding 06/24/2016          Physical Exam   Triage Vital Signs: ED Triage Vitals  Encounter Vitals Group     BP 02/24/24 1120 (!) 157/92     Girls Systolic BP Percentile --      Girls Diastolic BP Percentile --      Boys Systolic BP Percentile --      Boys Diastolic BP Percentile --      Pulse Rate 02/24/24 1120 87     Resp 02/24/24 1120 20     Temp 02/24/24 1120 97.7 F (36.5 C)     Temp Source 02/24/24 1120 Oral     SpO2 02/24/24 1120 92 %     Weight 02/24/24 1121 190 lb (86.2 kg)     Height 02/24/24 1121 5' 1 (1.549 m)     Head Circumference --      Peak Flow --      Pain Score 02/24/24 1121 7     Pain Loc --      Pain Education --      Exclude from Growth Chart --     Most recent vital signs: Vitals:   02/24/24 1120  BP: (!) 157/92  Pulse: 87  Resp: 20  Temp: 97.7 F (36.5 C)  SpO2: 92%    Physical Exam Vitals and nursing note reviewed.  Constitutional:      General: Awake and alert. No acute distress.    Appearance: Normal appearance.  HENT:     Head: Normocephalic and atraumatic.     Mouth: Mucous membranes are moist.  Eyes:     General: PERRL. Normal EOMs        Right eye: No discharge.        Left eye: No discharge.     Conjunctiva/sclera:  Conjunctivae normal.  Cardiovascular:     Rate and Rhythm: Normal rate and regular rhythm.     Pulses: Normal pulses.  Pulmonary:     Effort: Pulmonary effort is normal. No respiratory distress.     Breath sounds: Normal breath sounds.  Abdominal:     Abdomen is soft. There is no abdominal tenderness. No rebound or guarding. No distention. Musculoskeletal:        General: No swelling. Normal range of motion.     Cervical back: Normal range of motion and neck supple.  Right hip: Mildly tender to palpation, though normal active and passive range of motion.  Negative logroll.  No abdominal tenderness.  No lumbar tenderness.  Sensation intact and equal to touch bilaterally.  Normal pedal pulses bilaterally. Skin:    General: Skin is warm and dry.     Capillary Refill: Capillary  refill takes less than 2 seconds.     Findings: No rash.  Neurological:     Mental Status: The patient is awake and alert.      ED Results / Procedures / Treatments   Labs (all labs ordered are listed, but only abnormal results are displayed) Labs Reviewed  URINALYSIS, ROUTINE W REFLEX MICROSCOPIC - Abnormal; Notable for the following components:      Result Value   Color, Urine YELLOW (*)    APPearance CLEAR (*)    Hgb urine dipstick MODERATE (*)    Bacteria, UA RARE (*)    All other components within normal limits  URINE CULTURE     EKG     RADIOLOGY     PROCEDURES:  Critical Care performed:   Procedures   MEDICATIONS ORDERED IN ED: Medications  lidocaine  (LIDODERM ) 5 % 1 patch (1 patch Transdermal Patch Applied 02/24/24 1222)  acetaminophen  (TYLENOL ) tablet 650 mg (650 mg Oral Given 02/24/24 1218)  ibuprofen  (ADVIL ) tablet 600 mg (600 mg Oral Given 02/24/24 1350)     IMPRESSION / MDM / ASSESSMENT AND PLAN / ED COURSE  I reviewed the triage vital signs and the nursing notes.   Differential diagnosis includes, but is not limited to, bursitis, fracture, arthritis, sciatica/lumbar  radiculopathy, urinary tract infection.  Patient is awake and alert, hemodynamically stable and afebrile.  She is normal active and passive range of motion of bilateral hips, sensation is intact light touch throughout, no lumbar tenderness.  There are no constitutional symptoms or evidence of infection.  She has no abdominal tenderness on exam.  X-ray obtained in triage is negative for any acute bony pathology.  Patient feels reassured by these results.  She is able to actively and passively range her hip, no warmth or constitutional symptoms or fever to suggest septic hip.  She has normal strength and sensation in bilateral lower extremities, no saddle anesthesia, not consistent with cord compression.  Urinalysis also obtained given her urgency, and she has findings consistent with possible UTI.  Will send for culture.  Discussed all these findings with her daughter who would like us  to treat empirically while waiting for culture.  Keflex  was sent to the pharmacy.  Patient is able to ambulate independently without difficulty.  Daughter feels comfortable with patient being discharged home.  Patient is also comfortable with discharge home.  We discussed symptomatic management and return precautions.  I also recommend that she follow-up with orthopedics for her hip.  We discussed return precautions in the meantime.  Patient was discharged in stable condition.   Patient's presentation is most consistent with acute complicated illness / injury requiring diagnostic workup.   Clinical Course as of 02/24/24 1450  Wed Feb 24, 2024  1328 Discussed with her daughter [name] who reports that she has been complaining of hip pain for the last couple of days and her daughter just wanted to have her checked out.  She would like to be treated empirically for urinary tract infection at this time and not wait for the urine culture results.  Daughter reports that she is otherwise at her baseline [JP]    Clinical Course User  Index [JP] Kehinde Bowdish E, PA-C     FINAL CLINICAL IMPRESSION(S) / ED DIAGNOSES   Final diagnoses:  Pain of right hip  Acute cystitis with hematuria     Rx / DC Orders   ED Discharge Orders          Ordered  cephALEXin  (KEFLEX ) 500 MG capsule  4 times daily        02/24/24 1410             Note:  This document was prepared using Dragon voice recognition software and may include unintentional dictation errors.   Braydee Shimkus E, PA-C 02/24/24 1450    Viviann Pastor, MD 02/25/24 1320

## 2024-02-24 NOTE — ED Notes (Signed)
 Pt cleaned brief, gown, and bed linens changed. New chuck placed.

## 2024-02-24 NOTE — ED Notes (Signed)
 Daughter Georga Stys) called and is coming to pick the patient up.

## 2024-02-24 NOTE — Discharge Instructions (Addendum)
 Take the antibiotic as prescribed for your urinary tract infection.  Please follow-up with orthopedics for your hip pain.  Please return for any new, worsening, or changing symptoms or other concerns.  It was a pleasure caring for you today.

## 2024-02-26 NOTE — Progress Notes (Signed)
 " Subjective:    Chief Complaint  Patient presents with   Leg Pain    X 4 days R hip swelling and pain  Pt was seen in ED on 12/10 for same problem States she was treated for UTI and told she pulled a muscle States pain hasn't lessened, has numbness/tingling in toes of R foot Describes pain as throbbing and goes from hip to knee  Taking Ibuprofen  for pain, last dose this AM   Ok to start per SE     History was provided by the patient and EMR. Jessica Luna is a 68 y.o., female  who presents with 4-day history of pain in her right buttock, rating down her right leg, to her foot.  Patient seen in ED 2 days prior, had x-rays and ultrasounds of her right hip, felt to be unremarkable.  Patient presents here today with complaints of persistent intense pain.  Recalls having similar episode in the past-cannot recall diagnosis however.  No complaints of headache or dizziness; no chest pain or shortness of breath; no nausea, vomiting, diarrhea; no fevers or chills; no change in bowel or bladder habits. Symptoms include: above   Patient denies: above Treatment to date: above   Past Medical History:  Diagnosis Date   History of cancer    colon   Hypertension    The following portions of the patient's history were reviewed and updated as appropriate: allergies, current medications, past social history, and problem list.  Review of Systems A complete review of systems was performed.  Positive and pertinent negative responses are documented in the HPI, and all other systems are negative.   Objective:     Vitals:   02/26/24 1454  BP: (!) 163/83  Pulse: 85  Temp: 36.4 C (97.5 F)  TempSrc: Oral  SpO2: 96%  Weight: 82.1 kg (181 lb)  Height: 154.9 cm (5' 1)  PainSc:   8  PainLoc: Hip    General Appearance:  Well-developed, well-nourished.  Pleasant and cooperative.  No acute distress.  Seated in wheelchair. HEENT:  [place]/AT, PERRLA, EOMI, sclera clear, conjunctivae pink.  Nearly  edentulous. Neck:  Supple, no JVD or adenopathy. Cardiovascular:  Regular rate and rhythm.  Lungs: Minich, coarse throughout with fair effort. Abdomen:  Soft, nontender, nondistended. Normoactive bowel sounds.  Back: No CVA tenderness. Extremities:   No cyanosis, clubbing, or edema. Neuro:  Alert and orient x4; nonfocal with best test.    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 Acute right-sided low back pain with right-sided sciatica -     X-ray lumbar spine 2 to 3 views; Future -     predniSONE  (DELTASONE ) 20 MG tablet; Take 1 tablet twice daily for 5 days, then 1 tablet once daily for 5 days.  Take with food.  Dispense: 15 tablet; Refill: 0 -     cyclobenzaprine (FLEXERIL) 5 MG tablet; Take 1 tablet (5 mg total) by mouth 3 (three) times daily as needed for Muscle spasms for up to 20 doses  Dispense: 20 tablet; Refill: 0  2. Acute right hip pain -     X-ray hip right 2 or 3 views with or without pelvis; Future -     predniSONE  (DELTASONE ) 20 MG tablet; Take 1 tablet twice daily for 5 days, then 1 tablet once daily for 5 days.  Take with food.  Dispense: 15 tablet; Refill: 0 -  cyclobenzaprine (FLEXERIL) 5 MG tablet; Take 1 tablet (5 mg total) by mouth 3 (three) times daily as needed for Muscle spasms for up to 20 doses  Dispense: 20 tablet; Refill: 0   Prednisone , low-dose Flexeril, moist heat, rest.  Radiology report pending.  Call, return, ED if problems.    Plan:   Requested Prescriptions   Signed Prescriptions Disp Refills   predniSONE  (DELTASONE ) 20 MG tablet 15 tablet 0    Sig: Take 1 tablet twice daily for 5 days, then 1 tablet once daily for 5 days.  Take with food.   cyclobenzaprine (FLEXERIL) 5 MG tablet 20 tablet 0    Sig: Take 1 tablet (5 mg total) by mouth 3 (three) times daily as needed for Muscle spasms for up to 20 doses    "

## 2024-03-21 ENCOUNTER — Ambulatory Visit

## 2024-03-21 DIAGNOSIS — M79661 Pain in right lower leg: Secondary | ICD-10-CM

## 2024-03-21 DIAGNOSIS — M7989 Other specified soft tissue disorders: Secondary | ICD-10-CM

## 2024-03-23 ENCOUNTER — Ambulatory Visit: Admission: RE | Admit: 2024-03-23 | Source: Ambulatory Visit

## 2024-04-16 ENCOUNTER — Inpatient Hospital Stay

## 2024-04-16 ENCOUNTER — Emergency Department

## 2024-04-16 ENCOUNTER — Inpatient Hospital Stay
Admission: EM | Admit: 2024-04-16 | Source: Home / Self Care | Attending: Internal Medicine | Admitting: Internal Medicine

## 2024-04-16 ENCOUNTER — Other Ambulatory Visit: Payer: Self-pay

## 2024-04-16 DIAGNOSIS — R32 Unspecified urinary incontinence: Secondary | ICD-10-CM

## 2024-04-16 DIAGNOSIS — E860 Dehydration: Secondary | ICD-10-CM

## 2024-04-16 DIAGNOSIS — B182 Chronic viral hepatitis C: Secondary | ICD-10-CM | POA: Insufficient documentation

## 2024-04-16 DIAGNOSIS — F209 Schizophrenia, unspecified: Secondary | ICD-10-CM | POA: Diagnosis present

## 2024-04-16 DIAGNOSIS — I10 Essential (primary) hypertension: Secondary | ICD-10-CM | POA: Diagnosis present

## 2024-04-16 DIAGNOSIS — E0810 Diabetes mellitus due to underlying condition with ketoacidosis without coma: Secondary | ICD-10-CM

## 2024-04-16 DIAGNOSIS — D3912 Neoplasm of uncertain behavior of left ovary: Secondary | ICD-10-CM

## 2024-04-16 DIAGNOSIS — E1310 Other specified diabetes mellitus with ketoacidosis without coma: Principal | ICD-10-CM

## 2024-04-16 DIAGNOSIS — R531 Weakness: Secondary | ICD-10-CM

## 2024-04-16 DIAGNOSIS — E872 Acidosis, unspecified: Secondary | ICD-10-CM

## 2024-04-16 DIAGNOSIS — E1110 Type 2 diabetes mellitus with ketoacidosis without coma: Secondary | ICD-10-CM | POA: Diagnosis present

## 2024-04-16 DIAGNOSIS — E875 Hyperkalemia: Secondary | ICD-10-CM

## 2024-04-16 DIAGNOSIS — M79604 Pain in right leg: Secondary | ICD-10-CM

## 2024-04-16 DIAGNOSIS — K829 Disease of gallbladder, unspecified: Secondary | ICD-10-CM

## 2024-04-16 DIAGNOSIS — E1100 Type 2 diabetes mellitus with hyperosmolarity without nonketotic hyperglycemic-hyperosmolar coma (NKHHC): Secondary | ICD-10-CM

## 2024-04-16 DIAGNOSIS — N179 Acute kidney failure, unspecified: Secondary | ICD-10-CM

## 2024-04-16 DIAGNOSIS — G9341 Metabolic encephalopathy: Secondary | ICD-10-CM

## 2024-04-16 DIAGNOSIS — K709 Alcoholic liver disease, unspecified: Secondary | ICD-10-CM | POA: Diagnosis present

## 2024-04-16 LAB — URINALYSIS, W/ REFLEX TO CULTURE (INFECTION SUSPECTED)
Bilirubin Urine: NEGATIVE
Glucose, UA: >=500 mg/dL — AB
Ketones, ur: 20 mg/dL — AB
Leukocytes,Ua: NEGATIVE
Nitrite: NEGATIVE
Protein, ur: NEGATIVE mg/dL
Specific Gravity, Urine: 1.028 (ref 1.005–1.030)
pH: 5 (ref 5.0–8.0)

## 2024-04-16 LAB — CBC WITH DIFFERENTIAL/PLATELET
Abs Immature Granulocytes: 0.03 10*3/uL (ref 0.00–0.07)
Basophils Absolute: 0 10*3/uL (ref 0.0–0.1)
Basophils Relative: 0 %
Eosinophils Absolute: 0 10*3/uL (ref 0.0–0.5)
Eosinophils Relative: 0 %
HCT: 37.2 % (ref 36.0–46.0)
Hemoglobin: 11.5 g/dL — ABNORMAL LOW (ref 12.0–15.0)
Immature Granulocytes: 0 %
Lymphocytes Relative: 14 %
Lymphs Abs: 1.2 10*3/uL (ref 0.7–4.0)
MCH: 31.5 pg (ref 26.0–34.0)
MCHC: 30.9 g/dL (ref 30.0–36.0)
MCV: 101.9 fL — ABNORMAL HIGH (ref 80.0–100.0)
Monocytes Absolute: 0.9 10*3/uL (ref 0.1–1.0)
Monocytes Relative: 10 %
Neutro Abs: 6.9 10*3/uL (ref 1.7–7.7)
Neutrophils Relative %: 76 %
Platelets: 103 10*3/uL — ABNORMAL LOW (ref 150–400)
RBC: 3.65 MIL/uL — ABNORMAL LOW (ref 3.87–5.11)
RDW: 13.4 % (ref 11.5–15.5)
WBC: 9.1 10*3/uL (ref 4.0–10.5)
nRBC: 0 % (ref 0.0–0.2)

## 2024-04-16 LAB — URINE DRUG SCREEN
Amphetamines: NEGATIVE
Barbiturates: NEGATIVE
Benzodiazepines: NEGATIVE
Cocaine: NEGATIVE
Fentanyl: NEGATIVE
Methadone Scn, Ur: NEGATIVE
Opiates: NEGATIVE
Tetrahydrocannabinol: NEGATIVE

## 2024-04-16 LAB — BASIC METABOLIC PANEL WITH GFR
Anion gap: 23 — ABNORMAL HIGH (ref 5–15)
BUN: 15 mg/dL (ref 8–23)
CO2: 19 mmol/L — ABNORMAL LOW (ref 22–32)
Calcium: 8.7 mg/dL — ABNORMAL LOW (ref 8.9–10.3)
Chloride: 88 mmol/L — ABNORMAL LOW (ref 98–111)
Creatinine, Ser: 1.36 mg/dL — ABNORMAL HIGH (ref 0.44–1.00)
GFR, Estimated: 43 mL/min — ABNORMAL LOW
Glucose, Bld: 999 mg/dL (ref 70–99)
Potassium: 3.8 mmol/L (ref 3.5–5.1)
Sodium: 130 mmol/L — ABNORMAL LOW (ref 135–145)

## 2024-04-16 LAB — COMPREHENSIVE METABOLIC PANEL WITH GFR
ALT: 50 U/L — ABNORMAL HIGH (ref 0–44)
AST: 52 U/L — ABNORMAL HIGH (ref 15–41)
Albumin: 3.1 g/dL — ABNORMAL LOW (ref 3.5–5.0)
Alkaline Phosphatase: 113 U/L (ref 38–126)
Anion gap: 25 — ABNORMAL HIGH (ref 5–15)
BUN: 17 mg/dL (ref 8–23)
CO2: 19 mmol/L — ABNORMAL LOW (ref 22–32)
Calcium: 8.7 mg/dL — ABNORMAL LOW (ref 8.9–10.3)
Chloride: 83 mmol/L — ABNORMAL LOW (ref 98–111)
Creatinine, Ser: 1.65 mg/dL — ABNORMAL HIGH (ref 0.44–1.00)
GFR, Estimated: 34 mL/min — ABNORMAL LOW
Glucose, Bld: 1188 mg/dL (ref 70–99)
Potassium: 6.9 mmol/L (ref 3.5–5.1)
Sodium: 126 mmol/L — ABNORMAL LOW (ref 135–145)
Total Bilirubin: 0.7 mg/dL (ref 0.0–1.2)
Total Protein: 7.4 g/dL (ref 6.5–8.1)

## 2024-04-16 LAB — TROPONIN T, HIGH SENSITIVITY
Troponin T High Sensitivity: 14 ng/L (ref 0–19)
Troponin T High Sensitivity: 16 ng/L (ref 0–19)

## 2024-04-16 LAB — AMMONIA: Ammonia: 21 umol/L (ref 9–35)

## 2024-04-16 LAB — CBG MONITORING, ED
Glucose-Capillary: >600 mg/dL (ref 70–99)
Glucose-Capillary: >600 mg/dL (ref 70–99)
Glucose-Capillary: 474 mg/dL — ABNORMAL HIGH (ref 70–99)

## 2024-04-16 LAB — BLOOD GAS, VENOUS

## 2024-04-16 LAB — BETA-HYDROXYBUTYRIC ACID
Beta-Hydroxybutyric Acid: 6.96 mmol/L — ABNORMAL HIGH (ref 0.05–0.27)
Beta-Hydroxybutyric Acid: 4.72 mmol/L — ABNORMAL HIGH (ref 0.05–0.27)

## 2024-04-16 LAB — PROTIME-INR
INR: 1.1 (ref 0.8–1.2)
Prothrombin Time: 14.5 s (ref 11.4–15.2)

## 2024-04-16 LAB — LACTIC ACID, PLASMA: Lactic Acid, Venous: 3.2 mmol/L (ref 0.5–1.9)

## 2024-04-16 LAB — OSMOLALITY: Osmolality: 347 mosm/kg (ref 275–295)

## 2024-04-16 MED ORDER — VANCOMYCIN HCL IN DEXTROSE 1-5 GM/200ML-% IV SOLN
1000.0000 mg | Freq: Once | INTRAVENOUS | Status: DC
  Filled 2024-04-16: qty 200

## 2024-04-16 MED ORDER — INSULIN REGULAR(HUMAN) IN NACL 100-0.9 UT/100ML-% IV SOLN
INTRAVENOUS | Status: DC
  Administered 2024-04-16: 8.5 [IU]/h via INTRAVENOUS
  Filled 2024-04-16 (×2): qty 100

## 2024-04-16 MED ORDER — SODIUM CHLORIDE 0.9 % IV SOLN: 2.0000 g | Freq: Two times a day (BID) | INTRAVENOUS | Status: DC

## 2024-04-16 MED ORDER — LORAZEPAM 2 MG/ML IJ SOLN
1.0000 mg | Freq: Once | INTRAMUSCULAR | Status: AC | PRN
  Administered 2024-04-16: 1 mg via INTRAVENOUS
  Filled 2024-04-16: qty 1

## 2024-04-16 MED ORDER — METRONIDAZOLE 500 MG/100ML IV SOLN
500.0000 mg | Freq: Two times a day (BID) | INTRAVENOUS | Status: DC
  Administered 2024-04-16: 500 mg via INTRAVENOUS

## 2024-04-16 MED ORDER — LACTATED RINGERS IV BOLUS (SEPSIS)
1000.0000 mL | Freq: Once | INTRAVENOUS | Status: AC
  Administered 2024-04-17: 1000 mL via INTRAVENOUS

## 2024-04-16 MED ORDER — VANCOMYCIN HCL 2000 MG/400ML IV SOLN
2000.0000 mg | Freq: Once | INTRAVENOUS | Status: AC
  Administered 2024-04-17: 2000 mg via INTRAVENOUS
  Filled 2024-04-16: qty 400

## 2024-04-16 MED ORDER — CALCIUM GLUCONATE-NACL 1-0.675 GM/50ML-% IV SOLN
1.0000 g | Freq: Once | INTRAVENOUS | Status: AC
  Administered 2024-04-16: 1000 mg via INTRAVENOUS
  Filled 2024-04-16: qty 50

## 2024-04-16 MED ORDER — DEXTROSE 50 % IV SOLN: 0.0000 mL | INTRAVENOUS | Status: AC | PRN

## 2024-04-16 MED ORDER — DEXTROSE IN LACTATED RINGERS 5 % IV SOLN: INTRAVENOUS | Status: DC

## 2024-04-16 MED ORDER — LACTATED RINGERS IV BOLUS
20.0000 mL/kg | Freq: Once | INTRAVENOUS | Status: AC
  Administered 2024-04-16: 1606 mL via INTRAVENOUS

## 2024-04-16 MED ORDER — ENOXAPARIN SODIUM 40 MG/0.4ML IJ SOSY
0.5000 mg/kg | PREFILLED_SYRINGE | INTRAMUSCULAR | Status: AC
  Administered 2024-04-17 – 2024-04-22 (×7): 40 mg via SUBCUTANEOUS
  Filled 2024-04-16 (×7): qty 0.4

## 2024-04-16 MED ORDER — HYDROXYZINE HCL 25 MG PO TABS
25.0000 mg | ORAL_TABLET | Freq: Three times a day (TID) | ORAL | Status: AC | PRN
  Administered 2024-04-19 – 2024-04-20 (×3): 25 mg via ORAL
  Filled 2024-04-16 (×3): qty 1

## 2024-04-16 MED ORDER — METRONIDAZOLE 500 MG/100ML IV SOLN
500.0000 mg | Freq: Once | INTRAVENOUS | Status: DC
  Filled 2024-04-16: qty 100

## 2024-04-16 MED ORDER — VANCOMYCIN HCL 1500 MG/300ML IV SOLN: 1500.0000 mg | INTRAVENOUS | Status: DC

## 2024-04-16 MED ORDER — SODIUM CHLORIDE 0.9 % IV SOLN
2.0000 g | Freq: Once | INTRAVENOUS | Status: AC
  Administered 2024-04-16: 2 g via INTRAVENOUS
  Filled 2024-04-16: qty 12.5

## 2024-04-16 MED ORDER — LACTATED RINGERS IV SOLN: INTRAVENOUS | Status: DC

## 2024-04-16 MED ORDER — SODIUM ZIRCONIUM CYCLOSILICATE 10 G PO PACK
10.0000 g | PACK | Freq: Once | ORAL | Status: AC
  Administered 2024-04-16: 10 g via ORAL
  Filled 2024-04-16: qty 1

## 2024-04-16 MED ORDER — LACTATED RINGERS IV BOLUS
1000.0000 mL | Freq: Once | INTRAVENOUS | Status: AC
  Administered 2024-04-17: 1000 mL via INTRAVENOUS

## 2024-04-16 NOTE — Progress Notes (Signed)
 Pharmacy Antibiotic Note  Jessica Luna is a 69 y.o. female admitted on 04/16/2024 with sepsis.  Pharmacy has been consulted for Vancomycin , Cefepime  dosing.  Plan: Cefepime  2 gm IV X 1 given in ED on 1/31 @ 2117. Cefepime  2 gm IV Q12H ordered to start on 2/01 @ 0900.   Vancomycin  2 gm IV X 1 ordered for 1/31 @ ~ 2300. Vancomycin  1500 mg IV Q48H ordered to start on 2/02 @ 2300.  AUC = 510.6 Vanc trough = 11.3   Weight: 80.3 kg (177 lb 0.5 oz)  Temp (24hrs), Avg:97.9 F (36.6 C), Min:97.9 F (36.6 C), Max:97.9 F (36.6 C)  Recent Labs  Lab 04/16/24 1828 04/16/24 1836  WBC 9.1  --   CREATININE 1.65*  --   LATICACIDVEN  --  3.2*    Estimated Creatinine Clearance: 32.2 mL/min (A) (by C-G formula based on SCr of 1.65 mg/dL (H)).    Allergies[1]  Antimicrobials this admission:   >>    >>   Dose adjustments this admission:   Microbiology results:  BCx:   UCx:    Sputum:    MRSA PCR:   Thank you for allowing pharmacy to be a part of this patients care.  Jossie Smoot D 04/16/2024 10:16 PM     [1]  Allergies Allergen Reactions   Codeine Nausea And Vomiting

## 2024-04-16 NOTE — Assessment & Plan Note (Addendum)
 SIRS, possible sepsis of unknown source SIRS criteria include tachycardia and tachypnea, hypotension with EMS and lactic acidosis Suspect primarily related to DKA, ketoacidosis of alternate etiology starvation/alcohol use Lower suspicion for sepsis as no stigmata of acute infection Urinalysis sterile and chest x-ray clear Follow-up respiratory viral panel Getting CTA abdomen and pelvis in view of history of ovarian tumor Expecting improvement with IV fluid resuscitation Will repeat lactic acid until closer to normal Will get a procalcitonin Will empirically continue antibiotics pending blood cultures Addendum: CT IMPRESSION: 1. No acute findings in the abdomen or pelvis. 2. Small amount of intravesical gas within a decompressed urinary bladder, which may be related to recent instrumentation; if no recent instrumentation, consider infection or fistula.

## 2024-04-16 NOTE — Assessment & Plan Note (Addendum)
 DKA vs starvation/alcoholic ketoacidosis Partly steroid-induced hyperglycemia / No diabetes history HbA1c  5.21 December 2023, was 6.0 in 2023-> likely was prediabetic Received IM steroid shot 03/21/24 for right lumbar radiculopathy Glucose 1188 with anion gap 25, bicarb 19, beta hydroxy 6.98 and venous pH 7.22, serum osmolality 347 -Aggressive IV fluid resuscitation -Insulin  drip per Endo tool -Electrolyte management per Endo tool -Follow A1c -Will need diabetic educator consult

## 2024-04-16 NOTE — Assessment & Plan Note (Signed)
 History of post hysterectomy vesicovaginal fistula Urinalysis clean No acute issues suspected Will benefit from outpatient follow-up with urology/gynecology

## 2024-04-16 NOTE — Assessment & Plan Note (Signed)
 Continue home meds pending verification

## 2024-04-16 NOTE — Assessment & Plan Note (Signed)
 Multifactorial secondary to DKA/HHS, dehydration Expecting improvement with management of acute problems Neurologic checks with fall and aspiration precautions

## 2024-04-16 NOTE — ED Triage Notes (Signed)
 Pt arrives via [facility] from home for lethargy and L leg pain. Pts daughter said for the past week pt has been looking like a failure to thrive . Has had a UTI recently and just finished abx last week. HX of COPD.  EMS vitals: 103/60 Bp 105 HR 96% SPO2 27% CBG read High for EMS

## 2024-04-16 NOTE — Consult Note (Signed)
 CODE SEPSIS - PHARMACY COMMUNICATION  **Broad-spectrum antimicrobials should be administered within one hour of sepsis diagnosis**  Time Code Sepsis call or page was received: 2023  Antibiotics ordered: Cefepime , Vancomycin , Flagyl   Time of first antibiotic administration: 2117  Additional action taken by pharmacy: Msged RN    Will M. Lenon, PharmD, BCPS Clinical Pharmacist 04/16/2024 8:24 PM

## 2024-04-16 NOTE — Assessment & Plan Note (Signed)
 Creatinine 1.65, baseline 1.1 12/2023 Expect improvement with IV fluid resuscitation Monitor renal function and avoid nephrotoxins, renally dosing meds

## 2024-04-16 NOTE — Sepsis Progress Note (Addendum)
 Elink following for sepsis protocol, BS EDRN notified via chat with need to f/u on 2nd lactic with chat being recognized but no response

## 2024-04-16 NOTE — Assessment & Plan Note (Addendum)
 History of alcohol use disorder Chronic hepatitis C Mild LFT elevation with AST/ALT 52/50, improved from 96/67 in October 2025 No acute issues suspected Will get baseline EtOH as well as UDS

## 2024-04-16 NOTE — Assessment & Plan Note (Addendum)
 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 infection vs fistula, see report Will also get bilateral lower extremity venous ultrasound in view of history of low-grade malignancy--->negative

## 2024-04-16 NOTE — Hospital Course (Signed)
 SABRA

## 2024-04-16 NOTE — Assessment & Plan Note (Signed)
 Potassium 6.9, treated with calcium  gluconate No EKG changes related to hyperkalemia Expecting improvement with insulin  infusion Continue to monitor and treat potassium

## 2024-04-16 NOTE — Assessment & Plan Note (Signed)
 BP was low with EMS Will hold antihypertensives until more stabilized

## 2024-04-16 NOTE — Assessment & Plan Note (Deleted)
 HHS / possible starvation versus alcoholic ketoacidosis Partly steroid-induced hyperglycemia / history of prediabetes Patient with documented history of hyperglycemia HbA1c  5.21 December 2023, was 6.0 in 2023 Recently treated with steroids for persistent right hip and leg pain attributed to the lumbar radiculopathy Glucose 1188 with anion gap 25, bicarb 19, beta hydroxy 6.98 and venous pH 7.22, serum osmolality 347 Aggressive IV fluid resuscitation Insulin  drip per Endo tool Electrolyte management per Endo tool Follow A1c Will need diabetic educator consult

## 2024-04-17 ENCOUNTER — Inpatient Hospital Stay

## 2024-04-17 DIAGNOSIS — E1100 Type 2 diabetes mellitus with hyperosmolarity without nonketotic hyperglycemic-hyperosmolar coma (NKHHC): Secondary | ICD-10-CM | POA: Diagnosis not present

## 2024-04-17 LAB — BASIC METABOLIC PANEL WITH GFR
Anion gap: 18 — ABNORMAL HIGH (ref 5–15)
Anion gap: 13 (ref 5–15)
Anion gap: 9 (ref 5–15)
BUN: 13 mg/dL (ref 8–23)
BUN: 10 mg/dL (ref 8–23)
BUN: 9 mg/dL (ref 8–23)
CO2: 24 mmol/L (ref 22–32)
CO2: 25 mmol/L (ref 22–32)
CO2: 27 mmol/L (ref 22–32)
Calcium: 9 mg/dL (ref 8.9–10.3)
Calcium: 8.3 mg/dL — ABNORMAL LOW (ref 8.9–10.3)
Calcium: 8.3 mg/dL — ABNORMAL LOW (ref 8.9–10.3)
Chloride: 92 mmol/L — ABNORMAL LOW (ref 98–111)
Chloride: 98 mmol/L (ref 98–111)
Chloride: 105 mmol/L (ref 98–111)
Creatinine, Ser: 1.28 mg/dL — ABNORMAL HIGH (ref 0.44–1.00)
Creatinine, Ser: 0.98 mg/dL (ref 0.44–1.00)
Creatinine, Ser: 0.95 mg/dL (ref 0.44–1.00)
GFR, Estimated: 46 mL/min — ABNORMAL LOW
GFR, Estimated: >60 mL/min
GFR, Estimated: >60 mL/min
Glucose, Bld: 641 mg/dL (ref 70–99)
Glucose, Bld: 373 mg/dL — ABNORMAL HIGH (ref 70–99)
Glucose, Bld: 138 mg/dL — ABNORMAL HIGH (ref 70–99)
Potassium: 3.8 mmol/L (ref 3.5–5.1)
Potassium: 3.5 mmol/L (ref 3.5–5.1)
Potassium: 3.4 mmol/L — ABNORMAL LOW (ref 3.5–5.1)
Sodium: 135 mmol/L (ref 135–145)
Sodium: 136 mmol/L (ref 135–145)
Sodium: 141 mmol/L (ref 135–145)

## 2024-04-17 LAB — RESP PANEL BY RT-PCR (RSV, FLU A&B, COVID)  RVPGX2
Influenza A by PCR: NEGATIVE
Influenza B by PCR: NEGATIVE
Resp Syncytial Virus by PCR: NEGATIVE
SARS Coronavirus 2 by RT PCR: NEGATIVE

## 2024-04-17 LAB — LACTIC ACID, PLASMA: Lactic Acid, Venous: 6.8 mmol/L (ref 0.5–1.9)

## 2024-04-17 LAB — BETA-HYDROXYBUTYRIC ACID
Beta-Hydroxybutyric Acid: 2.05 mmol/L — ABNORMAL HIGH (ref 0.05–0.27)
Beta-Hydroxybutyric Acid: 0.35 mmol/L — ABNORMAL HIGH (ref 0.05–0.27)
Beta-Hydroxybutyric Acid: 0.14 mmol/L (ref 0.05–0.27)
Beta-Hydroxybutyric Acid: 0.06 mmol/L (ref 0.05–0.27)

## 2024-04-17 LAB — CBG MONITORING, ED
Glucose-Capillary: 392 mg/dL — ABNORMAL HIGH (ref 70–99)
Glucose-Capillary: >600 mg/dL (ref 70–99)
Glucose-Capillary: 296 mg/dL — ABNORMAL HIGH (ref 70–99)
Glucose-Capillary: 256 mg/dL — ABNORMAL HIGH (ref 70–99)
Glucose-Capillary: 222 mg/dL — ABNORMAL HIGH (ref 70–99)
Glucose-Capillary: 277 mg/dL — ABNORMAL HIGH (ref 70–99)
Glucose-Capillary: 283 mg/dL — ABNORMAL HIGH (ref 70–99)
Glucose-Capillary: 177 mg/dL — ABNORMAL HIGH (ref 70–99)
Glucose-Capillary: 169 mg/dL — ABNORMAL HIGH (ref 70–99)
Glucose-Capillary: 269 mg/dL — ABNORMAL HIGH (ref 70–99)
Glucose-Capillary: 92 mg/dL (ref 70–99)
Glucose-Capillary: 140 mg/dL — ABNORMAL HIGH (ref 70–99)
Glucose-Capillary: 224 mg/dL — ABNORMAL HIGH (ref 70–99)

## 2024-04-17 LAB — BLOOD GAS, VENOUS
Bicarbonate: 21.7 mmol/L — AB (ref 20.0–28.0)
O2 Saturation: 45.1 mmol/L — AB (ref 0.0–2.0)
Patient temperature: 37
Patient temperature: 45.1
pCO2, Ven: 53 mmHg (ref 44–60)
pH, Ven: 7.22 — ABNORMAL LOW (ref 7.25–7.43)
pO2, Ven: 21.7 mmol/L (ref 32–45)

## 2024-04-17 LAB — HEMOGLOBIN A1C
Hgb A1c MFr Bld: 13.8 % — ABNORMAL HIGH (ref 4.8–5.6)
Mean Plasma Glucose: 349.36 mg/dL

## 2024-04-17 LAB — PROCALCITONIN: Procalcitonin: 0.53 ng/mL

## 2024-04-17 LAB — LIPASE, BLOOD: Lipase: 28 U/L (ref 11–51)

## 2024-04-17 LAB — ETHANOL: Alcohol, Ethyl (B): <15 mg/dL

## 2024-04-17 MED ORDER — POTASSIUM CHLORIDE 10 MEQ/100ML IV SOLN
10.0000 meq | INTRAVENOUS | Status: AC
  Administered 2024-04-17 – 2024-04-18 (×3): 10 meq via INTRAVENOUS
  Filled 2024-04-17 (×2): qty 100

## 2024-04-17 MED ORDER — INSULIN ASPART 100 UNIT/ML IJ SOLN
0.0000 [IU] | Freq: Three times a day (TID) | INTRAMUSCULAR | Status: DC
  Administered 2024-04-18: 11 [IU] via SUBCUTANEOUS
  Administered 2024-04-18: 4 [IU] via SUBCUTANEOUS
  Administered 2024-04-18: 7 [IU] via SUBCUTANEOUS
  Administered 2024-04-19 (×2): 15 [IU] via SUBCUTANEOUS
  Administered 2024-04-19: 4 [IU] via SUBCUTANEOUS
  Administered 2024-04-20: 7 [IU] via SUBCUTANEOUS
  Administered 2024-04-20: 4 [IU] via SUBCUTANEOUS
  Administered 2024-04-20: 11 [IU] via SUBCUTANEOUS
  Administered 2024-04-21: 3 [IU] via SUBCUTANEOUS
  Filled 2024-04-17: qty 3
  Filled 2024-04-17 (×2): qty 4
  Filled 2024-04-17: qty 7
  Filled 2024-04-17: qty 15
  Filled 2024-04-17: qty 11
  Filled 2024-04-17: qty 15
  Filled 2024-04-17: qty 7
  Filled 2024-04-17: qty 11
  Filled 2024-04-17: qty 4

## 2024-04-17 MED ORDER — POTASSIUM CHLORIDE 10 MEQ/100ML IV SOLN
10.0000 meq | INTRAVENOUS | Status: AC
  Administered 2024-04-17 (×2): 10 meq via INTRAVENOUS
  Filled 2024-04-17: qty 100

## 2024-04-17 MED ORDER — SODIUM CHLORIDE 0.9 % IV BOLUS
1000.0000 mL | Freq: Once | INTRAVENOUS | Status: AC
  Administered 2024-04-17: 1000 mL via INTRAVENOUS

## 2024-04-17 MED ORDER — INSULIN GLARGINE-YFGN 100 UNIT/ML ~~LOC~~ SOLN
10.0000 [IU] | Freq: Every day | SUBCUTANEOUS | Status: DC
  Administered 2024-04-17: 10 [IU] via SUBCUTANEOUS
  Filled 2024-04-17 (×2): qty 0.1

## 2024-04-17 MED ORDER — POTASSIUM CHLORIDE 10 MEQ/100ML IV SOLN
10.0000 meq | INTRAVENOUS | Status: DC
  Administered 2024-04-17: 10 meq via INTRAVENOUS
  Filled 2024-04-17: qty 100

## 2024-04-17 MED ORDER — LACTATED RINGERS IV SOLN: INTRAVENOUS | Status: DC

## 2024-04-17 NOTE — Progress Notes (Signed)
 " PROGRESS NOTE    Jessica Luna  FMW:[id number] DOB: [birth year] DOA: 04/16/2024 PCP: Fernand Sigrid HERO, MD  [id number]  LOS: 1 day   Brief hospital course:   Assessment & Plan: Jessica Luna is a 69 y.o. female  being admitted with DKA / HHS / New onset diabetes possibly steroid shot precipitated. Aic was 5.6 three months ago.  She was brought in by EMS with altered mental status/increased somnolence following a daughter-requested wellness check, due to concern that patient's caregiver was unable to make it to her home for several days due to the snow and ice.   Her Past medical history is significant for Schizophrenia, granulosa cell tumor left ovary 2019 (s/p hysterectomy/bilateral salpingo oophorectomy), complicated by vesicovaginal fistula, prior documentation of alcohol use disorder / alcoholic liver disease /  chronic hepatitis C and HTN. Chart review reveals that patient had been complaining about right hip and leg pain for the past two months with an ED visit 03/05/2024 (treated for cystitis) with PCP follow-up visit 03/22/2023, when she received an IM Depo-Medrol shot with an orthopedic referral.   DKA  New onset diabetic HbA1c  5.21 December 2023, was 6.0 in 2023-> likely was prediabetic Received IM steroid shot 03/21/24 for right lumbar radiculopathy Glucose 1188 with anion gap 25, bicarb 19, beta hydroxy 6.98 and venous pH 7.22, serum osmolality 347 --off insulin  gtt today --start glargine 10u daily --ACHS and SSI -Will need diabetic educator consult  Acute metabolic encephalopathy --2/2 DKA --hold oral intake until pt is more alert  AKI (acute kidney injury) Creatinine 1.65, baseline 1.1 12/2023 --cont MIVF  Hyperkalemia Potassium 6.9, treated with calcium  gluconate No EKG changes related to hyperkalemia --resolved with insulin  gtt  Lactic acidosis --due to DKA  Sepsis ruled out --no infectious source --hold further abx  Right pelvic and leg pain History of  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 infection vs fistula, see report Will also get bilateral lower extremity venous ultrasound in view of history of low-grade malignancy--->negative   Urinary incontinence History of post hysterectomy vesicovaginal fistula Urinalysis clean No acute issues suspected Will benefit from outpatient follow-up with urology/gynecology  History of alcoholic liver disease History of alcohol use disorder Chronic hepatitis C Mild LFT elevation with AST/ALT 52/50, improved from 96/67 in October 2025 No acute issues suspected Will get baseline EtOH as well as UDS  Schizophrenia, chronic condition (HCC)  Essential hypertension BP was low with EMS --hold antihypertensives   DVT prophylaxis: Lovenox  SQ Code Status: Full code  Family Communication:  Level of care: Med-Surg Dispo:   The patient is from: home Anticipated d/c is to: home Anticipated d/c date is: 2-3 days   Subjective and Interval History:  Pt remained lethargic, arousable but not interactive.     Objective: Vitals:   04/17/24 0939 04/17/24 1100 04/17/24 1300 04/17/24 1530  BP:  (!) 102/49 116/63 125/66  Pulse:  (!) 102 (!) 107 (!) 109  Resp:  16 17 19   Temp: 98.2 F (36.8 C)     TempSrc: Axillary     SpO2:  100% 100% 99%  Weight:        Intake/Output Summary (Last 24 hours) at 04/17/2024 1737 Last data filed  at 04/17/2024 0615 Gross per 24 hour  Intake --  Output 700 ml  Net -700 ml   Filed Weights   04/16/24 1818  Weight: 80.3 kg    Examination:   Constitutional: NAD, lethargic, arousable HEENT: conjunctivae and lids normal, EOMI CV: No cyanosis.    RESP: normal respiratory effort, on 2L   Data Reviewed: I have personally reviewed labs and imaging studies  Time spent: 50 minutes  Ellouise Haber, MD [facility] If 7PM-7AM, please contact night-coverage 04/17/2024, 5:37 PM   "

## 2024-04-17 NOTE — ED Notes (Signed)
 Pt placed in mittens by staff last night. Pt attempts to pull at lines and devices.

## 2024-04-17 NOTE — Inpatient Diabetes Management (Addendum)
 Inpatient Diabetes Program Recommendations  AACE/ADA: New Consensus Statement on Inpatient Glycemic Control (2015)  Target Ranges:  Prepandial:   less than 140 mg/dL      Peak postprandial:   less than 180 mg/dL (1-2 hours)      Critically ill patients:  140 - 180 mg/dL   Lab Results  Component Value Date   GLUCAP 283 (H) 04/17/2024    Review of Glycemic Control  Latest Reference Range & Units 04/17/24 06:54 04/17/24 08:28  Glucose-Capillary 70 - 99 mg/dL 722 (H) 716 (H)  (H): Data is abnormally high Diabetes history: New onset DM? Outpatient Diabetes medications: none Current orders for Inpatient glycemic control: IV insulin  Prednisone  taper started (03/21/24) Medrol injection (03/21/24)  Inpatient Diabetes Program Recommendations:    A1C in process; anticipate elevation.  Drip rates while on IV insulin  remain mostly >9 units/hr.   When ready to transition consider: - Lantus /glargine 20 units two hours prior to discontinuation of IV insulin  then every day to follow with option discussed with MD for BID  -Novolog  5 units TID (Assuming patient is consuming >50% of meals) -Novolog  0-15 units TID & HS  Will await A1C for official diagnosis in order to begin education.   Thanks, Tinnie Minus, MSN, RNC-OB Diabetes Coordinator [phone] (8a-5p)

## 2024-04-18 ENCOUNTER — Encounter: Payer: Self-pay | Admitting: Internal Medicine

## 2024-04-18 DIAGNOSIS — E1100 Type 2 diabetes mellitus with hyperosmolarity without nonketotic hyperglycemic-hyperosmolar coma (NKHHC): Secondary | ICD-10-CM | POA: Diagnosis not present

## 2024-04-18 LAB — CBG MONITORING, ED
Glucose-Capillary: 242 mg/dL — ABNORMAL HIGH (ref 70–99)
Glucose-Capillary: 285 mg/dL — ABNORMAL HIGH (ref 70–99)
Glucose-Capillary: 383 mg/dL — ABNORMAL HIGH (ref 70–99)

## 2024-04-18 LAB — GLUCOSE, CAPILLARY
Glucose-Capillary: 288 mg/dL — ABNORMAL HIGH (ref 70–99)
Glucose-Capillary: 189 mg/dL — ABNORMAL HIGH (ref 70–99)
Glucose-Capillary: 277 mg/dL — ABNORMAL HIGH (ref 70–99)

## 2024-04-18 LAB — BASIC METABOLIC PANEL WITH GFR
Anion gap: 10 (ref 5–15)
BUN: 14 mg/dL (ref 8–23)
CO2: 24 mmol/L (ref 22–32)
Calcium: 8.3 mg/dL — ABNORMAL LOW (ref 8.9–10.3)
Chloride: 103 mmol/L (ref 98–111)
Creatinine, Ser: 0.94 mg/dL (ref 0.44–1.00)
GFR, Estimated: >60 mL/min
Glucose, Bld: 300 mg/dL — ABNORMAL HIGH (ref 70–99)
Potassium: 4 mmol/L (ref 3.5–5.1)
Sodium: 136 mmol/L (ref 135–145)

## 2024-04-18 LAB — CBC
HCT: 36.6 % (ref 36.0–46.0)
Hemoglobin: 11.8 g/dL — ABNORMAL LOW (ref 12.0–15.0)
MCH: 31.6 pg (ref 26.0–34.0)
MCHC: 32.2 g/dL (ref 30.0–36.0)
MCV: 98.1 fL (ref 80.0–100.0)
Platelets: 84 10*3/uL — ABNORMAL LOW (ref 150–400)
RBC: 3.73 MIL/uL — ABNORMAL LOW (ref 3.87–5.11)
RDW: 13.2 % (ref 11.5–15.5)
WBC: 8.1 10*3/uL (ref 4.0–10.5)
nRBC: 0 % (ref 0.0–0.2)

## 2024-04-18 LAB — MAGNESIUM: Magnesium: 2 mg/dL (ref 1.7–2.4)

## 2024-04-18 LAB — HIV ANTIBODY (ROUTINE TESTING W REFLEX): HIV Screen 4th Generation wRfx: NONREACTIVE

## 2024-04-18 MED ORDER — ALBUTEROL SULFATE (2.5 MG/3ML) 0.083% IN NEBU
2.5000 mg | INHALATION_SOLUTION | Freq: Once | RESPIRATORY_TRACT | Status: AC
  Administered 2024-04-18: 2.5 mg via RESPIRATORY_TRACT
  Filled 2024-04-18: qty 3

## 2024-04-18 MED ORDER — LACTATED RINGERS IV SOLN: INTRAVENOUS | Status: AC

## 2024-04-18 MED ORDER — INSULIN ASPART 100 UNIT/ML IJ SOLN
5.0000 [IU] | Freq: Once | INTRAMUSCULAR | Status: AC
  Administered 2024-04-18: 5 [IU] via SUBCUTANEOUS
  Filled 2024-04-18: qty 5

## 2024-04-18 MED ORDER — DIPHENHYDRAMINE HCL 50 MG/ML IJ SOLN
12.5000 mg | Freq: Once | INTRAMUSCULAR | Status: AC
  Administered 2024-04-18: 12.5 mg via INTRAVENOUS
  Filled 2024-04-18: qty 1

## 2024-04-18 MED ORDER — MELATONIN 5 MG PO TABS
5.0000 mg | ORAL_TABLET | Freq: Every evening | ORAL | Status: AC | PRN
  Administered 2024-04-19 (×2): 5 mg via ORAL
  Filled 2024-04-18 (×2): qty 1

## 2024-04-18 MED ORDER — LACTATED RINGERS IV BOLUS
500.0000 mL | Freq: Once | INTRAVENOUS | Status: AC
  Administered 2024-04-18: 500 mL via INTRAVENOUS

## 2024-04-18 MED ORDER — LIVING WELL WITH DIABETES BOOK
Freq: Once | Status: AC
  Filled 2024-04-18: qty 1

## 2024-04-18 MED ORDER — INSULIN STARTER KIT- PEN NEEDLES (ENGLISH)
1.0000 | Freq: Once | Status: AC
  Administered 2024-04-18: 1
  Filled 2024-04-18: qty 1

## 2024-04-18 MED ORDER — INSULIN GLARGINE-YFGN 100 UNIT/ML ~~LOC~~ SOLN
20.0000 [IU] | Freq: Every day | SUBCUTANEOUS | Status: DC
  Administered 2024-04-18 – 2024-04-19 (×2): 20 [IU] via SUBCUTANEOUS
  Filled 2024-04-18 (×2): qty 0.2

## 2024-04-18 NOTE — Progress Notes (Signed)
 Attempted to contact family to make them aware that pt admitted to room 108 and to answer admission profile questions, no answer on any of pt contacts numbers

## 2024-04-18 NOTE — Inpatient Diabetes Management (Signed)
 Inpatient Diabetes Program Recommendations  AACE/ADA: New Consensus Statement on Inpatient Glycemic Control   Target Ranges:  Prepandial:   less than 140 mg/dL      Peak postprandial:   less than 180 mg/dL (1-2 hours)      Critically ill patients:  140 - 180 mg/dL    Latest Reference Range & Units 04/17/24 05:40 04/17/24 06:54 04/17/24 08:28 04/17/24 09:38 04/17/24 10:52 04/17/24 11:50 04/17/24 13:08 04/17/24 14:28 04/17/24 19:40 04/18/24 00:10 04/18/24 07:46  Glucose-Capillary 70 - 99 mg/dL 777 (H) 722 (H) 716 (H) 177 (H) 169 (H) 269 (H) 92 140 (H) 224 (H) 383 (H) 285 (H)  (H): Data is abnormally high  Review of Glycemic Control  Diabetes history: PreDM (per PCP note on 12/31/23) Outpatient Diabetes medications: None Current orders for Inpatient glycemic control: Semglee  20 units daily, Novolog  0-20 units TID with meals  Inpatient Diabetes Program Recommendations:    Insulin : Patient was given Semglee  10 units at 11:53 am on 04/17/24 prior to transitioning from IV to SQ insulin . Noted Semglee  increased to 20 units daily today.  Please consider increasing Semglee  further to 25 units daily and if patient will remain NPO, change CBGs to Q4H and Novolog  0-20 units to Q4H.  If diet is ordered, please consider ordering Novolog  5 units TID with meals for meal coverage if patient eats at least 50% of meals and adding Novolog  0-5 units QHS.  HbgA1C: A1C 13.8% on 04/16/24 indicating an average glucose of 349 mg/dl over the past 2-3 months. A1C of 5.6% on 12/31/23 noted in Care Everywhere.  NOTE: Noted consult for inpatient diabetes coordinator. Diabetes coordinator working remotely today. Patient admitted with HHS, DKA versus starvation/alcoholic ketoacidosis, new DM, AKI, hyperkalemia, lactic acidosis, and acute metabolic encephalopathy.  Initial lab glucose 1188 mg/dl and patient was on IV insulin  which was transitioned to SQ insulin  on 04/17/24.  Noted patient seen PCP on 02/26/24 and was prescribed  Prednisone  taper. Seen on 03/21/24 and given steroid injection plus prescribed Prednisone  taper.   Will plan to talk with patient when appropriate for education regarding new DM dx.   Thanks, Earnie Gainer, RN, MSN, CDCES Diabetes Coordinator Inpatient Diabetes Program [phone] (Team Pager from 8am to 5pm)

## 2024-04-18 NOTE — ED Notes (Signed)
 Patient I/O per K. Fou

## 2024-04-19 DIAGNOSIS — E1100 Type 2 diabetes mellitus with hyperosmolarity without nonketotic hyperglycemic-hyperosmolar coma (NKHHC): Secondary | ICD-10-CM | POA: Diagnosis not present

## 2024-04-19 LAB — GLUCOSE, CAPILLARY
Glucose-Capillary: 332 mg/dL — ABNORMAL HIGH (ref 70–99)
Glucose-Capillary: 302 mg/dL — ABNORMAL HIGH (ref 70–99)
Glucose-Capillary: 168 mg/dL — ABNORMAL HIGH (ref 70–99)
Glucose-Capillary: 222 mg/dL — ABNORMAL HIGH (ref 70–99)

## 2024-04-19 MED ORDER — LACTATED RINGERS IV SOLN: INTRAVENOUS | Status: AC

## 2024-04-19 MED ORDER — INSULIN GLARGINE-YFGN 100 UNIT/ML ~~LOC~~ SOLN
30.0000 [IU] | Freq: Every day | SUBCUTANEOUS | Status: DC
  Administered 2024-04-20: 30 [IU] via SUBCUTANEOUS
  Filled 2024-04-19: qty 0.3

## 2024-04-19 MED ORDER — INSULIN GLARGINE-YFGN 100 UNIT/ML ~~LOC~~ SOLN
10.0000 [IU] | Freq: Once | SUBCUTANEOUS | Status: AC
  Administered 2024-04-19: 10 [IU] via SUBCUTANEOUS
  Filled 2024-04-19: qty 0.1

## 2024-04-19 MED ORDER — ACETAMINOPHEN 325 MG PO TABS
650.0000 mg | ORAL_TABLET | Freq: Four times a day (QID) | ORAL | Status: AC | PRN
  Administered 2024-04-19 – 2024-04-20 (×2): 650 mg via ORAL
  Filled 2024-04-19 (×2): qty 2

## 2024-04-19 NOTE — Discharge Instructions (Signed)

## 2024-04-19 NOTE — Progress Notes (Signed)
 Brief Nutrition Education Note   RD consulted for nutrition education regarding diabetes.   Lab Results  Component Value Date   HGBA1C 13.8 (H) 04/16/2024   PTA DM medications are none.   Labs reviewed: CBGS: 189-383 (inpatient orders for glycemic control are 0-20 units insulin  aspart TID with meals and 30 units insulin  glargine-yfgn daily).    Patient sleeping soundly at time of visit. She did not respond to name being called. No family present.   Reviewed DM coordinator note; patient denied having DM at time or visit and refused taking medications at discharge.   RD provided Carbohydrate Counting for People with Diabetes and Plate Method handouts from the Academy of Nutrition and Dietetics. Attached to AVS/ discharge summary.   RD also referred patient to [facility] Nutrition and Diabetes Education Services.   Body mass index is 30.39 kg/m. Pt meets criteria for obesity, class I based on current BMI. Obesity is a complex, chronic medical condition that is optimally managed by a multidisciplinary care team. Weight loss is not an ideal goal for an acute inpatient hospitalization. However, if further work-up for obesity is warranted, consider outpatient referral to [facility].    Current diet order is carb modified, patient is consuming approximately 100% of meals at this time. Labs and medications reviewed. No further nutrition interventions warranted at this time. RD contact information provided. If additional nutrition issues arise, please re-consult RD.  Margery ORN, RD, LDN, CDCES Registered Dietitian III Certified Diabetes Care and Education Specialist If unable to reach this RD, please use RD Inpatient group chat on secure chat between hours of 8am-4 pm daily

## 2024-04-19 NOTE — Inpatient Diabetes Management (Signed)
 Inpatient Diabetes Program Recommendations  AACE/ADA: New Consensus Statement on Inpatient Glycemic Control   Target Ranges:  Prepandial:   less than 140 mg/dL      Peak postprandial:   less than 180 mg/dL (1-2 hours)      Critically ill patients:  140 - 180 mg/dL    Latest Reference Range & Units 04/18/24 00:10 04/18/24 07:46 04/18/24 11:53 04/18/24 12:41 04/18/24 16:52 04/18/24 21:54 04/19/24 07:35 04/19/24 11:48  Glucose-Capillary 70 - 99 mg/dL 616 (H) 714 (H) 757 (H) 288 (H) 189 (H) 277 (H) 332 (H) 302 (H)    Latest Reference Range & Units 04/16/24 18:28  CO2 22 - 32 mmol/L 19 (L)  Glucose 70 - 99 mg/dL 8,811 (HH)  Anion gap 5 - 15  25 (H)    Latest Reference Range & Units 04/16/24 23:40  Hemoglobin A1C 4.8 - 5.6 % 13.8 (H)   Review of Glycemic Control  Diabetes history: PreDM (per PCP note on 12/31/23) Outpatient Diabetes medications: None Current orders for Inpatient glycemic control: Semglee  30 units daily, Novolog  0-20 units TID with meals   Inpatient Diabetes Program Recommendations:     Insulin : Noted Semglee  increased from 20 units to 30 units daily today.  If patient is eating, please consider ordering Novolog  5 units TID with meals for meal coverage if patient eats at least 50% of meals and adding Novolog  0-5 units QHS.   HbgA1C: A1C 13.8% on 04/16/24 indicating an average glucose of 349 mg/dl over the past 2-3 months. A1C of 5.6% on 12/31/23 noted in Care Everywhere.   NOTE:  Patient admitted with HHS, DKA versus starvation/alcoholic ketoacidosis, new DM, AKI, hyperkalemia, lactic acidosis, and acute metabolic encephalopathy.  Initial lab glucose 1188 mg/dl and patient was on IV insulin  which was transitioned to SQ insulin  on 04/17/24.  Noted patient seen PCP on 02/26/24 and was prescribed Prednisone  taper. Seen on 03/21/24 and given steroid injection plus prescribed Prednisone  taper.   Spoke with patient at bedside regarding new DM dx. Patient is able to verbalize her name  and where she. Patient has her cell phone in her hand and keeps tapping the screen. When asked what she is doing, she states that she needs to call her daughter to come pick her up because she does not have any money and can not stay in the hospital. Patient states she does not know how to use her phone to call her daughter. Asked if she would like me to help and she stated, No. I will call her.  Discussed that she is being dx with DM at this time. Patient states she does not have DM. Discussed that her glucose was 1188 mg/dl when she arrived to the hospital and her A1C was 13.8% on 04/16/24. Discussed what an A1C is. Patient stated, I don't have DM. I came to the hospital about my leg. I think you are wrong. I won't believe I have DM unless my doctor tells me.  Explained that she is currently receiving insulin  because her glucose is high still and she will need to take insulin  at home. Patient stated, I am not going to take any medication for DM because I don't have DM, like I told you. Discussed that she does have DM and she is going to need to start checking her glucose and taking DM medication after discharge. Patient adamant she does not have DM and does not need any DM medication. Informed patient that I will plan to follow up with her  tomorrow if appropriate. Talked with RN about conversation with patient and also sent chat message to Dr. Awanda.  Thanks, Earnie Gainer, RN, MSN, CDCES Diabetes Coordinator Inpatient Diabetes Program [phone] (Team Pager from 8am to 5pm)

## 2024-04-19 NOTE — Plan of Care (Signed)

## 2024-04-19 NOTE — Progress Notes (Signed)
 " PROGRESS NOTE    Jessica Luna  FMW:[id number] DOB: Sep 10, 1955 DOA: 04/16/2024 PCP: Fernand Sigrid HERO, MD  [id number]/[id number]-AA  LOS: 3 days   Brief hospital course:   Assessment & Plan: Jessica Luna is a 69 y.o. female  being admitted with DKA / HHS / New onset diabetes possibly steroid shot precipitated. Aic was 5.6 three months ago.  She was brought in by EMS with altered mental status/increased somnolence following a daughter-requested wellness check, due to concern that patient's caregiver was unable to make it to her home for several days due to the snow and ice.   Her Past medical history is significant for Schizophrenia, granulosa cell tumor left ovary 2019 (s/p hysterectomy/bilateral salpingo oophorectomy), complicated by vesicovaginal fistula, prior documentation of alcohol use disorder / alcoholic liver disease /  chronic hepatitis C and HTN. Chart review reveals that patient had been complaining about right hip and leg pain for the past two months with an ED visit 03/05/2024 (treated for cystitis) with PCP follow-up visit 03/22/2023, when she received an IM Depo-Medrol shot with an orthopedic referral.   DKA  New onset diabetic HbA1c  5.21 December 2023, was 6.0 in 2023-> likely was prediabetic Received IM steroid shot 03/21/24 for right lumbar radiculopathy Glucose 1188 with anion gap 25, bicarb 19, beta hydroxy 6.98 and venous pH 7.22, serum osmolality 347 --off insulin  gtt on 2/1 --increase glargine to 30u daily --ACHS and SSI --cont MIVF -Will need diabetic educator consult  Acute metabolic encephalopathy, improved --2/2 DKA  AKI (acute kidney injury) Creatinine 1.65, baseline 1.1 12/2023 --cont MIVF  Hyperkalemia Potassium 6.9, treated with calcium  gluconate No EKG changes related to hyperkalemia --resolved with insulin  gtt  Lactic acidosis --due to DKA --cont MIVF  Sepsis ruled out --no infectious source --hold further abx  Right pelvic and leg pain History of  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 infection vs fistula, see report Will also get bilateral lower extremity venous ultrasound in view of history of low-grade malignancy--->negative   Urinary incontinence History of post hysterectomy vesicovaginal fistula Urinalysis clean No acute issues suspected Will benefit from outpatient follow-up with urology/gynecology  History of alcoholic liver disease History of alcohol use disorder Chronic hepatitis C Mild LFT elevation with AST/ALT 52/50, improved from 96/67 in October 2025 No acute issues suspected Will get baseline EtOH as well as UDS  Schizophrenia, chronic condition (HCC)  Essential hypertension BP was low with EMS --hold antihypertensives   DVT prophylaxis: Lovenox  SQ Code Status: Full code  Family Communication:  Level of care: Med-Surg Dispo:   The patient is from: home Anticipated d/c is to: home Anticipated d/c date is: 1-2 days   Subjective and Interval History:  Pt was more alert this morning, responding to questions, though her answers seemed unreliable.     Objective: Vitals:   04/19/24 0453 04/19/24 0603 04/19/24 0734 04/19/24 1549  BP: 111/64  123/73 128/80  Pulse: (!) 102  (!) 101 98  Resp: (!) 24 (!) 24 17   Temp: 99.7 F (37.6 C) 99.3 F (37.4 C) 98.4 F (36.9 C) 98.3 F (36.8 C)  TempSrc: Axillary Axillary    SpO2: 94%  95% 98%  Weight:  Height:        Intake/Output Summary (Last 24 hours) at 04/19/2024 2001 Last data filed at 04/19/2024 1832 Gross per 24 hour  Intake 565.18 ml  Output --  Net 565.18 ml   Filed Weights   04/16/24 1818  Weight: 80.3 kg     Examination:   Constitutional: NAD, alert, confused HEENT: conjunctivae and lids normal, EOMI CV: No cyanosis.   RESP: normal respiratory effort   Data Reviewed: I have personally reviewed labs and imaging studies  Time spent: 35 minutes  Ellouise Haber, MD [facility] If 7PM-7AM, please contact night-coverage 04/19/2024, 8:01 PM   "

## 2024-04-19 NOTE — Care Management Important Message (Signed)
 Important Message  Patient Details  Name: Jessica Luna MRN: [id number] Date of Birth: 10-20-1955   Important Message Given:  Yes - Medicare IM     Kathren Scearce 04/19/2024, 1:40 PM

## 2024-04-20 DIAGNOSIS — E1100 Type 2 diabetes mellitus with hyperosmolarity without nonketotic hyperglycemic-hyperosmolar coma (NKHHC): Secondary | ICD-10-CM | POA: Diagnosis not present

## 2024-04-20 LAB — GLUCOSE, CAPILLARY
Glucose-Capillary: 220 mg/dL — ABNORMAL HIGH (ref 70–99)
Glucose-Capillary: 286 mg/dL — ABNORMAL HIGH (ref 70–99)
Glucose-Capillary: 199 mg/dL — ABNORMAL HIGH (ref 70–99)
Glucose-Capillary: 203 mg/dL — ABNORMAL HIGH (ref 70–99)
Glucose-Capillary: 204 mg/dL — ABNORMAL HIGH (ref 70–99)

## 2024-04-20 MED ORDER — INSULIN GLARGINE-YFGN 100 UNIT/ML ~~LOC~~ SOLN
40.0000 [IU] | Freq: Every day | SUBCUTANEOUS | Status: AC
  Administered 2024-04-21 – 2024-04-22 (×2): 40 [IU] via SUBCUTANEOUS
  Filled 2024-04-20 (×2): qty 0.4

## 2024-04-20 MED ORDER — NICOTINE 14 MG/24HR TD PT24
14.0000 mg | MEDICATED_PATCH | Freq: Every day | TRANSDERMAL | Status: AC
  Administered 2024-04-20 – 2024-04-22 (×3): 14 mg via TRANSDERMAL
  Filled 2024-04-20 (×3): qty 1

## 2024-04-20 MED ORDER — NICOTINE POLACRILEX 2 MG MT GUM: 2.0000 mg | CHEWING_GUM | OROMUCOSAL | Status: AC | PRN

## 2024-04-20 MED ORDER — INSULIN GLARGINE-YFGN 100 UNIT/ML ~~LOC~~ SOLN
10.0000 [IU] | Freq: Once | SUBCUTANEOUS | Status: AC
  Administered 2024-04-20: 10 [IU] via SUBCUTANEOUS
  Filled 2024-04-20: qty 0.1

## 2024-04-20 NOTE — Plan of Care (Signed)

## 2024-04-20 NOTE — Evaluation (Signed)
 Physical Therapy Evaluation Patient Details Name: Jessica Luna MRN: [id number] DOB: March 27, 1955 Today's Date: 04/20/2024  History of Present Illness  Patient is a 69 year old female with AMS, increased somnolence. New onset diabetic with hyperosmolar hyperglycemic state. 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 Patient is cooperative during session. She is oriented to place and person only. She is a poor historian but reports she lives in her own apartment on the 2nd floor with a flight of steps to enter. She states a friend lives with her but unable to tell me the friend's name. She reports she is independent with mobility without a device at home.  Today the patient required assistance for bed mobility and transfers. Attempted to ambulate, however patient is unable to due to reported leg pain and generalized weakness. She was able to stand x 2 bouts and get to the chair for breakfast. The patient does not appear to be at her baseline level of functional independence. Recommend PT follow up to maximize independence and decrease caregiver burden. Consider rehabilitation < 3 hours/day after this hospital stay.        If plan is discharge home, recommend the following: A lot of help with walking and/or transfers;A little help with bathing/dressing/bathroom;Assistance with cooking/housework;Assist for transportation;Help with stairs or ramp for entrance;Supervision due to cognitive status   Can travel by private vehicle   No    Equipment Recommendations Rolling walker (2 wheels)  Recommendations for Other Services  OT consult    Functional Status Assessment Patient has had a recent decline in their functional status and demonstrates the ability to make significant improvements in function in a reasonable and predictable amount of time.      Precautions / Restrictions Precautions Precautions: Fall Recall of Precautions/Restrictions: Impaired Restrictions Weight Bearing Restrictions Per Provider Order: No      Mobility  Bed Mobility Overal bed mobility: Needs Assistance Bed Mobility: Sidelying to Sit   Sidelying to sit: Mod assist       General bed mobility comments: assistance mostly for trunk support to sit upright. also assistance required for scooting towards edge of bed    Transfers Overall transfer level: Needs assistance Equipment used: Rolling walker (2 wheels) Transfers: Sit to/from Stand, Bed to chair/wheelchair/BSC Sit to Stand: Min assist   Step pivot transfers: Min assist       General transfer comment: cues for technique. standing performed x 1 bout from bed and then step pivot transfer from bed to recliner chair as patient unable to walk at this time    Ambulation/Gait             Pre-gait activities: weight shifting faciliation required for step pivot transfer to chair using rolling walker General Gait Details: unable to due to weakness and reported leg pain  Stairs            Wheelchair Mobility     Tilt Bed    Modified Rankin (Stroke Patients Only)       Balance Overall balance assessment: Needs assistance Sitting-balance support: Feet supported Sitting balance-Leahy Scale: Fair     Standing balance support: Bilateral upper extremity supported Standing balance-Leahy Scale: Poor Standing balance comment: steadying assistance required with dyanmic activity  Pertinent Vitals/Pain Pain Assessment Pain Assessment: Faces Faces Pain Scale: Hurts a little bit Pain Location: both legs Pain Descriptors / Indicators: Discomfort Pain Intervention(s): Limited activity within patient's tolerance, Monitored during session, Repositioned    Home Living Family/patient expects to be discharged to:: Private residence Living  Arrangements: Non-relatives/Friends Available Help at Discharge:  (unsure) Type of Home: Apartment Home Access: Stairs to enter   Entrance Stairs-Number of Steps: 1 flight     Home Equipment: None Additional Comments: patient reports a friend lives with her (but unable to tell me the friend's name). she reports her daughter lives close, then states she does not live here (unable to tell me her daughter's name). she is a poor historian    Prior Function Prior Level of Function : Patient poor historian/Family not available             Mobility Comments: patient reports she is ambulatory without a device at home but  I need a walker ADLs Comments: unsure. patient reports her friend drives for groceries. poor historian     Extremity/Trunk Assessment   Upper Extremity Assessment Upper Extremity Assessment: Generalized weakness    Lower Extremity Assessment Lower Extremity Assessment: Generalized weakness       Communication   Communication Communication: No apparent difficulties    Cognition Arousal: Alert Behavior During Therapy: WFL for tasks assessed/performed   PT - Cognitive impairments: No family/caregiver present to determine baseline, Orientation, Safety/Judgement, Initiation   Orientation impairments: Time, Situation                   PT - Cognition Comments: decreased awareness of overall deficits Following commands: Impaired Following commands impaired: Follows one step commands with increased time     Cueing Cueing Techniques: Verbal cues, Tactile cues     General Comments      Exercises     Assessment/Plan    PT Assessment Patient needs continued PT services  PT Problem List Decreased strength;Decreased range of motion;Decreased activity tolerance;Decreased balance;Decreased mobility;Decreased cognition;Decreased knowledge of use of DME;Decreased safety awareness       PT Treatment Interventions DME instruction;Gait training;Stair  training;Functional mobility training;Therapeutic activities;Therapeutic exercise;Neuromuscular re-education;Balance training;Cognitive remediation;Patient/family education;Wheelchair mobility training    PT Goals (Current goals can be found in the Care Plan section)  Acute Rehab PT Goals Patient Stated Goal: to go home PT Goal Formulation: With patient Time For Goal Achievement: 05/04/24 Potential to Achieve Goals: Fair    Frequency Min 2X/week     Co-evaluation               AM-PAC PT 6 Clicks Mobility  Outcome Measure Help needed turning from your back to your side while in a flat bed without using bedrails?: A Lot Help needed moving from lying on your back to sitting on the side of a flat bed without using bedrails?: A Lot Help needed moving to and from a bed to a chair (including a wheelchair)?: A Little Help needed standing up from a chair using your arms (e.g., wheelchair or bedside chair)?: A Little Help needed to walk in hospital room?: A Lot Help needed climbing 3-5 steps with a railing? : A Lot 6 Click Score: 14    End of Session   Activity Tolerance: Patient limited by fatigue Patient left: in chair;with call bell/phone within reach;with chair alarm set Nurse Communication: Mobility status PT Visit Diagnosis: Muscle weakness (generalized) (M62.81);Unsteadiness on feet (R26.81)    Time: 9195-9177 PT Time Calculation (min) (ACUTE ONLY):  18 min   Charges:   PT Evaluation $PT Eval Low Complexity: 1 Low PT Treatments $Therapeutic Activity: 8-22 mins PT General Charges $$ ACUTE PT VISIT: 1 Visit        Randine Essex, PT, MPT   Randine LULLA Essex 04/20/2024, 8:38 AM

## 2024-04-20 NOTE — [place] FL2 (Signed)
 " [place]  MEDICAID [id number] LEVEL OF CARE FORM     IDENTIFICATION  Patient Name: Jessica Luna Birthdate: 07-04-1955 Sex: female Admission Date (Current Location): 04/16/2024  [facility] and [place] Number:  Chiropodist and Address:  [facility], [address] [place], [place] [address]      Provider Number:    Attending Physician Name and Address:  Awanda City, MD  Relative Name and Phone Number:  LOY LITTLE  Son  Emergency Contact  [phone]    Current Level of Care: Hospital Recommended Level of Care: Skilled Nursing Facility Prior Approval Number:    Date Approved/Denied:   PASRR Number:    Discharge Plan: SNF    Current Diagnoses: Patient Active Problem List   Diagnosis Date Noted   DKA (diabetic ketoacidosis) (HCC) 04/16/2024   Lactic acidosis 04/16/2024   Acute metabolic encephalopathy 04/16/2024   Hyperosmolar hyperglycemic state (HHS) (HCC) 04/16/2024   Chronic hepatitis C (HCC) 04/16/2024   Right pelvic and leg pain 04/16/2024   Granulosa cell tumor of left ovary s/p hysterectomy and bilateral salpingo-oophorectomy 2019 04/16/2024   Hyperkalemia 04/16/2024   Urinary incontinence 04/16/2024   AKI (acute kidney injury) 04/16/2024   Psychosis (HCC) 09/19/2022   Schizophrenia, chronic condition (HCC) 04/15/2018   Ovarian cancer (HCC) 12/16/2017   Fibroid uterus 04/11/2017   Lower abdominal pain 06/24/2016   Postmenopausal bleeding 06/24/2016   History of alcoholic liver disease 11/02/2014   Essential hypertension 11/02/2014   History of schizophrenia 11/02/2014    Orientation RESPIRATION BLADDER Height & Weight     Self  Normal Incontinent Weight: 80.3 kg Height:  5' 4 (162.6 cm)  BEHAVIORAL SYMPTOMS/MOOD NEUROLOGICAL BOWEL NUTRITION STATUS      Incontinent Diet (carb modified, thin liquds)  AMBULATORY STATUS COMMUNICATION OF NEEDS Skin   Extensive Assist Verbally Normal                       Personal  Care Assistance Level of Assistance  Bathing, Dressing Bathing Assistance: Limited assistance   Dressing Assistance: Limited assistance     Functional Limitations Info             SPECIAL CARE FACTORS FREQUENCY                       Contractures Contractures Info: Not present    Additional Factors Info  Code Status, Allergies Code Status Info: Full Code Allergies Info: codeine           Current Medications (04/20/2024):  This is the current hospital active medication list Current Facility-Administered Medications  Medication Dose Route Frequency Provider Last Rate Last Admin   acetaminophen  (TYLENOL ) tablet 650 mg  650 mg Oral Q6H PRN Foust, Katy L, NP   650 mg at 04/20/24 0933   dextrose  50 % solution 0-50 mL  0-50 mL Intravenous PRN Tan, Ting Xu, MD       enoxaparin  (LOVENOX ) injection 40 mg  0.5 mg/kg Subcutaneous Q24H Duncan, Hazel V, MD   40 mg at 04/19/24 2202   hydrOXYzine  (ATARAX ) tablet 25 mg  25 mg Oral TID PRN Duncan, Hazel V, MD   25 mg at 04/19/24 2203   insulin  aspart (novoLOG ) injection 0-20 Units  0-20 Units Subcutaneous TID WC Awanda City, MD   7 Units at 04/20/24 9078   insulin  glargine-yfgn injection 10 Units  10 Units Subcutaneous Once Awanda City, MD       [  START ON 04/21/2024] insulin  glargine-yfgn injection 40 Units  40 Units Subcutaneous Daily Awanda City, MD       lactated ringers  infusion   Intravenous Continuous Awanda City, MD 125 mL/hr at 04/19/24 2311 New Bag at 04/19/24 2311   melatonin tablet 5 mg  5 mg Oral QHS PRN Foust, Katy L, NP   5 mg at 04/19/24 2203     Discharge Medications: Please see discharge summary for a list of discharge medications.  Relevant Imaging Results:  Relevant Lab Results:   Additional Information    Grayce JAYSON Perfect, RN     "

## 2024-04-20 NOTE — Inpatient Diabetes Management (Addendum)
 Inpatient Diabetes Program Recommendations  AACE/ADA: New Consensus Statement on Inpatient Glycemic Control  Target Ranges:  Prepandial:   less than 140 mg/dL      Peak postprandial:   less than 180 mg/dL (1-2 hours)      Critically ill patients:  140 - 180 mg/dL    Latest Reference Range & Units 04/18/24 07:46 04/18/24 11:53 04/18/24 12:41 04/18/24 16:52 04/18/24 21:54 04/19/24 07:35 04/19/24 11:48 04/19/24 18:08 04/19/24 21:08  Glucose-Capillary 70 - 99 mg/dL 714 (H) 757 (H) 711 (H) 189 (H) 277 (H) 332 (H) 302 (H) 168 (H) 222 (H)    Latest Reference Range & Units 04/16/24 18:28  CO2 22 - 32 mmol/L 19 (L)  Glucose 70 - 99 mg/dL 8,811 (HH)  Anion gap 5 - 15  25 (H)   Review of Glycemic Control  Diabetes history: PreDM (per PCP note on 12/31/23) Outpatient Diabetes medications: None Current orders for Inpatient glycemic control: Semglee  30 units daily, Novolog  0-20 units TID with meals   Inpatient Diabetes Program Recommendations:     Insulin :   If patient is eating and CBGs are consistently over 180 mg/dl, please consider ordering Novolog  5 units TID with meals for meal coverage if patient eats at least 50% of meals and adding Novolog  0-5 units QHS.   HbgA1C: A1C 13.8% on 04/16/24 indicating an average glucose of 349 mg/dl over the past 2-3 months. A1C of 5.6% on 12/31/23 noted in Care Everywhere.  NOTE: Will plan to see patient again today to discuss new DM dx if appropriate.  Addendum 04/20/24@11 :15-Spoke with patient at bedside. Patient able to verbalize her name and that she is at the hospital. Patient drowsy and kept drifting off to sleep during conversation. Patient reports that the hospital doctor has told her she has DM and that she was going to need DM medications. Patient reports that her mother had DM and she has an aunt that has it. Patient states she knows about DM because of her mother and aunt. Asked a few questions about DM basics and patient could not provide correct  answers.  Patient has Living Well with DM book at bedside. Asked patient to read the book to learn more. Discussed A1C results (13.8% on 04/16/24) and explained what an A1C is and informed patient that current A1C indicates an average glucose of 349 mg/dl over the past 2-3 months. Discussed basic pathophysiology of DM Type 2, basic home care, importance of checking CBGs and maintaining good CBG control to prevent long-term and short-term complications. Reviewed glucose and A1C goals.  Reviewed signs and symptoms of hyperglycemia and hypoglycemia along with treatment for both. Discussed impact of nutrition, exercise, stress, sickness, and medications on diabetes control. Informed patient that she will likely need to take insulin  outpatient. Patient reports that she would take insulin  if needed. Reviewed insulin  pen and how to use it properly. Patient is too drowsy to return demonstrate and states she knows how to do it. Informed patient that our team will keep following and will see her again to review DM and insulin . Asked patient if I could call her daughter to discuss DM and insulin  and she gave permission to call her.  Patient will need assistance as an outpatient to help with DM management and will need someone to assist with insulin  injections until she is able to self administer safely.   Patient verbalized understanding of information discussed and she states that she has no further questions at this time related to diabetes.  RNs to provide ongoing basic DM education at bedside with this patient and engage patient to actively check blood glucose and administer insulin  injections.   Thanks, Earnie Gainer, RN, MSN, CDCES Diabetes Coordinator Inpatient Diabetes Program [phone] (Team Pager from 8am to 5pm)

## 2024-04-20 NOTE — Progress Notes (Addendum)
 " PROGRESS NOTE    Jessica Luna  FMW:[id number] DOB: Oct 29, 1955 DOA: 04/16/2024 PCP: Fernand Sigrid HERO, MD  [id number]/[id number]-AA  LOS: 4 days   Brief hospital course:   Assessment & Plan: Jessica Luna is a 69 y.o. female  being admitted with DKA / HHS / New onset diabetes possibly steroid shot precipitated. Aic was 5.6 three months ago.  She was brought in by EMS with altered mental status/increased somnolence following a daughter-requested wellness check, due to concern that patient's caregiver was unable to make it to her home for several days due to the snow and ice.   Her Past medical history is significant for Schizophrenia, granulosa cell tumor left ovary 2019 (s/p hysterectomy/bilateral salpingo oophorectomy), complicated by vesicovaginal fistula, prior documentation of alcohol use disorder / alcoholic liver disease /  chronic hepatitis C and HTN. Chart review reveals that patient had been complaining about right hip and leg pain for the past two months with an ED visit 03/05/2024 (treated for cystitis) with PCP follow-up visit 03/22/2023, when she received an IM Depo-Medrol shot with an orthopedic referral.   DKA  New onset diabetic HbA1c  5.21 December 2023, was 6.0 in 2023-> likely was prediabetic Received IM steroid shot 03/21/24 for right lumbar radiculopathy Glucose 1188 with anion gap 25, bicarb 19, beta hydroxy 6.98 and venous pH 7.22, serum osmolality 347 --off insulin  gtt on 2/1 --increase glargine to 40u daily --ACHS and SSI -Will need diabetic educator consult  Acute metabolic encephalopathy, improved --2/2 DKA  AKI (acute kidney injury) Creatinine 1.65, baseline 1.1 12/2023 --s/p MIVF --oral hydration now  Hyperkalemia Potassium 6.9, treated with calcium  gluconate No EKG changes related to hyperkalemia --resolved with insulin  gtt  Lactic acidosis --due to DKA  Sepsis ruled out --no infectious source --hold further abx  Right pelvic and leg pain History of  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 infection vs fistula, see report Will also get bilateral lower extremity venous ultrasound in view of history of low-grade malignancy--->negative   Urinary incontinence History of post hysterectomy vesicovaginal fistula Urinalysis clean No acute issues suspected Will benefit from outpatient follow-up with urology/gynecology  History of alcoholic liver disease History of alcohol use disorder Chronic hepatitis C Mild LFT elevation with AST/ALT 52/50, improved from 96/67 in October 2025 No acute issues suspected Will get baseline EtOH as well as UDS  Schizophrenia, chronic condition (HCC)  Essential hypertension BP was low with EMS --hold antihypertensives  Obesity class I, 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     Objective: Vitals:   04/19/24 2109 04/20/24 0625 04/20/24 0851 04/20/24 1513  BP: 132/79 (!) 142/94 133/72 126/79  Pulse: 98 (!) 104 (!) 107 98  Resp: 17 16 18 18   Temp: 98 F (36.7 C) 98.7 F (37.1 C) 97.9 F (36.6 C) 98.3 F (36.8 C)  TempSrc:   Oral Oral  SpO2: 97% 100% 98% 100%  Weight:  Height:        Intake/Output Summary (Last 24 hours) at 04/20/2024 1915 Last data filed at 04/20/2024 1700 Gross per 24 hour  Intake 2320.57 ml  Output --  Net 2320.57 ml   Filed Weights   04/16/24 1818  Weight: 80.3 kg     Examination:   Constitutional: NAD, lethargic, but arousable HEENT: conjunctivae and lids normal, EOMI CV: No cyanosis.   RESP: normal respiratory effort, on RA Extremities: No effusions, edema in BLE SKIN: warm, dry   Data Reviewed: I have personally reviewed labs and imaging studies  Time spent: 35 minutes  Ellouise Haber, MD [facility] If 7PM-7AM, please contact night-coverage 04/20/2024, 7:15 PM   "

## 2024-04-20 NOTE — Progress Notes (Signed)
     RE:     Date of Birth:   ___________  Date:        To Whom It May Concern:  Please be advised that the above-named patient will require a short-term nursing home stay - anticipated 30 days or less for rehabilitation and strengthening.  The plan is for return home.     

## 2024-04-20 NOTE — TOC Progression Note (Signed)
 Transition of Care [facility]) - Progression Note    Patient Details  Name: Jessica Luna MRN: [id number] Date of Birth: 07-29-1955  Transition of [facility]) CM/SW Contact  Grayce JAYSON Perfect, RN Phone Number: 04/20/2024, 5:25 PM  Clinical Narrative:   RN spoke with [name], daughter, at length due to patient refusing SNF rehab.  She is going to talk with her to encourage her to accept rehab.  She currently has [facility] that comes in and assist with her meds, errands.  RNCM will follow up with that service.  RNCM explained process of short term rehab and intent to go home after completion.  She states understanding and that patient does need to go as her Friends are older and really cannot assist her as needed, especially if she is not walking well.  She is going to call me back to confirm plan.    Will plan for [facility] or rehab.  FL2 complete and working on [employer]    Expected Discharge Plan: Home w Home Health Services Barriers to Discharge: Continued Medical Work up               Expected Discharge Plan and Services   Discharge Planning Services: CM Consult   Living arrangements for the past 2 months: Single Family Home                           HH Arranged: RN, Disease Management, PT, OT HH Agency:  (referrals sent out)         Social Drivers of Health (SDOH) Interventions SDOH Screenings   Food Insecurity: Patient Unable To Answer (04/18/2024)  Housing: Unknown (04/18/2024)  Transportation Needs: Patient Unable To Answer (04/18/2024)  Utilities: Patient Unable To Answer (04/18/2024)  Financial Resource Strain: Low Risk  (03/21/2024)   Received from [facility]  Social Connections: Patient Unable To Answer (04/18/2024)  Tobacco Use: High Risk (04/18/2024)    Readmission Risk Interventions     No data to display

## 2024-04-20 NOTE — Evaluation (Signed)
 Occupational Therapy Evaluation Patient Details Name: Jessica Luna MRN: [id number] DOB: 1955-12-05 Today's Date: 04/20/2024   History of Present Illness   Patient is a 69 year old female with AMS, increased somnolence. New onset diabetic with hyperosmolar hyperglycemic state. 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 Patient reports living at home with family and being Ind at baseline. She is confused this session and no family present to confirm information. Pt has been incontinent in bed and needing max- total A for hygiene to change bed and hospital gown. Pt stands and takes side steps along EOB with min A and use of RW. She fatigues very quickly and returns to supine at end of session.  Patient will benefit from acute OT to increase overall independence in the areas of ADLs, functional mobility, and safety awareness in order to safely discharge.     If plan is discharge home, recommend the following:   A lot of help with walking and/or transfers;A lot of help with bathing/dressing/bathroom;Assistance with cooking/housework;Help with stairs or ramp for entrance;Direct supervision/assist for medications management;Direct supervision/assist for financial management;Assist for transportation;Supervision due to cognitive status     Functional Status Assessment   Patient has had a recent decline in their functional status and demonstrates the ability to make significant improvements in function in a reasonable and predictable amount of time.     Equipment Recommendations   Other (comment) (defer)      Precautions/Restrictions   Precautions Precautions: Fall Recall of Precautions/Restrictions: Impaired     Mobility  Bed Mobility Overal bed mobility: Needs Assistance Bed Mobility: Rolling Rolling: Min assist, Mod assist              Transfers Overall transfer level: Needs assistance Equipment used: Rolling walker (2 wheels) Transfers: Sit to/from Stand, Bed to chair/wheelchair/BSC Sit to Stand: Min assist     Step pivot transfers: Min assist            Balance Overall balance assessment: Needs assistance Sitting-balance support: Feet supported Sitting balance-Leahy Scale: Fair     Standing balance support: Bilateral upper extremity supported Standing balance-Leahy Scale: Poor                             ADL either performed or assessed with clinical judgement   ADL Overall ADL's : Needs assistance/impaired                                       General ADL Comments: Pt incontinent of BM in bed. Max A for hygiene and clothing management.     Vision Patient Visual Report: No change from baseline              Pertinent Vitals/Pain Pain Assessment Pain Assessment: Faces Faces Pain Scale: Hurts a little bit Pain Location: generalize Pain Descriptors / Indicators: Discomfort Pain Intervention(s): Limited activity within patient's tolerance, Monitored during session, Repositioned     Extremity/Trunk Assessment Upper Extremity Assessment Upper Extremity Assessment: Generalized weakness   Lower Extremity Assessment Lower Extremity Assessment: Generalized weakness       Communication     Cognition Arousal: Alert Behavior During Therapy: [facility] for  tasks assessed/performed Cognition: No family/caregiver present to determine baseline, Cognition impaired   Orientation impairments: Time, Situation Awareness: Intellectual awareness impaired, Online awareness impaired   Attention impairment (select first level of impairment): Sustained attention Executive functioning impairment (select all impairments): Sequencing, Reasoning, Problem solving OT  - Cognition Comments: Pt laying in bed in BM and is aware but had not notified staff                 Following commands: Impaired Following commands impaired: Follows one step commands with increased time                Home Living Family/patient expects to be discharged to:: Private residence Living Arrangements: Non-relatives/Friends   Type of Home: Apartment Home Access: Stairs to enter Secretary/administrator of Steps: 1 flight   Home Layout: One level     Bathroom Shower/Tub: Sponge bathes at baseline         Home Equipment: None          Prior Functioning/Environment Prior Level of Function : Patient poor historian/Family not available             Mobility Comments: patient reports she is ambulatory without a device at home ADLs Comments: Pt reports being Mod I for self care and friend drives and assists with IADLs    OT Problem List: Decreased strength;Decreased activity tolerance;Impaired balance (sitting and/or standing);Decreased safety awareness   OT Treatment/Interventions: Self-care/ADL training;Therapeutic exercise;Patient/family education;Balance training;Energy conservation;Therapeutic activities;DME and/or AE instruction      OT Goals(Current goals can be found in the care plan section)   Acute Rehab OT Goals Patient Stated Goal: to go home OT Goal Formulation: With patient Time For Goal Achievement: 05/04/24 Potential to Achieve Goals: Fair ADL Goals Pt Will Perform Grooming: sitting;with supervision Pt Will Perform Lower Body Dressing: with min assist;sit to/from stand Pt Will Transfer to Toilet: with min assist Pt Will Perform Toileting - Clothing Manipulation and hygiene: with min assist   OT Frequency:  Min 2X/week       AM-PAC OT 6 Clicks Daily Activity     Outcome Measure Help from another person eating meals?: None Help from another person taking care of personal grooming?: A Little Help from another person  toileting, which includes using toliet, bedpan, or urinal?: A Lot Help from another person bathing (including washing, rinsing, drying)?: A Lot Help from another person to put on and taking off regular upper body clothing?: A Little Help from another person to put on and taking off regular lower body clothing?: A Lot 6 Click Score: 16   End of Session Nurse Communication: Mobility status  Activity Tolerance: Patient limited by fatigue Patient left: in bed;with call bell/phone within reach;with bed alarm set  OT Visit Diagnosis: Repeated falls (R29.6);Muscle weakness (generalized) (M62.81);Unsteadiness on feet (R26.81)                Time: 8963-8944 OT Time Calculation (min): 19 min Charges:  OT General Charges $OT Visit: 1 Visit OT Evaluation $OT Eval Low Complexity: 1 Low OT Treatments $Self Care/Home Management : 8-22 mins  Izetta Claude, MS, OTR/L , CBIS ascom [phone]  04/20/24, 2:06 PM

## 2024-04-20 NOTE — TOC Progression Note (Signed)
 Transition of Care [facility] - Progression Note    Patient Details  Name: Jessica Luna MRN: [id number] Date of Birth: April 10, 1955  [facility] CM/SW Contact  Grayce JAYSON Perfect, RN Phone Number: 04/20/2024, 4:35 PM  Clinical Narrative:   RNCM met with patient in her room today.  Discussed PT/OT recommendation for short term rehab at facility.  She states she is not going to a facility, I am going home  RN ask who was going to assist her in home, she states my friends  RNCM will contact daughter to notify her of this note and continue process of rehab vs home with [facility] services.  FL2 and PASRR started.      Expected Discharge Plan: Home w Home Health Services Barriers to Discharge: Continued Medical Work up               Expected Discharge Plan and Services   Discharge Planning Services: CM Consult   Living arrangements for the past 2 months: Single Family Home                           HH Arranged: RN, Disease Management, PT, OT HH Agency:  (referrals sent out)         Social Drivers of Health (SDOH) Interventions SDOH Screenings   Food Insecurity: Patient Unable To Answer (04/18/2024)  Housing: Unknown (04/18/2024)  Transportation Needs: Patient Unable To Answer (04/18/2024)  Utilities: Patient Unable To Answer (04/18/2024)  Financial Resource Strain: Low Risk  (03/21/2024)   Received from [facility]  Social Connections: Patient Unable To Answer (04/18/2024)  Tobacco Use: High Risk (04/18/2024)    Readmission Risk Interventions     No data to display

## 2024-04-21 ENCOUNTER — Inpatient Hospital Stay

## 2024-04-21 DIAGNOSIS — E1100 Type 2 diabetes mellitus with hyperosmolarity without nonketotic hyperglycemic-hyperosmolar coma (NKHHC): Secondary | ICD-10-CM | POA: Diagnosis not present

## 2024-04-21 LAB — GLUCOSE, CAPILLARY
Glucose-Capillary: 127 mg/dL — ABNORMAL HIGH (ref 70–99)
Glucose-Capillary: 225 mg/dL — ABNORMAL HIGH (ref 70–99)
Glucose-Capillary: 231 mg/dL — ABNORMAL HIGH (ref 70–99)
Glucose-Capillary: 241 mg/dL — ABNORMAL HIGH (ref 70–99)
Glucose-Capillary: 342 mg/dL — ABNORMAL HIGH (ref 70–99)

## 2024-04-21 LAB — CULTURE, BLOOD (ROUTINE X 2)
Culture: NO GROWTH
Special Requests: ADEQUATE

## 2024-04-21 LAB — CREATININE, SERUM
Creatinine, Ser: 0.78 mg/dL (ref 0.44–1.00)
GFR, Estimated: >60 mL/min

## 2024-04-21 LAB — LACTIC ACID, PLASMA: Lactic Acid, Venous: 1 mmol/L (ref 0.5–1.9)

## 2024-04-21 MED ORDER — INSULIN ASPART 100 UNIT/ML IJ SOLN
0.0000 [IU] | Freq: Three times a day (TID) | INTRAMUSCULAR | Status: AC
  Administered 2024-04-21: 7 [IU] via SUBCUTANEOUS
  Administered 2024-04-21 – 2024-04-22 (×2): 3 [IU] via SUBCUTANEOUS
  Administered 2024-04-22 (×2): 2 [IU] via SUBCUTANEOUS
  Filled 2024-04-21: qty 7
  Filled 2024-04-21 (×2): qty 2
  Filled 2024-04-21 (×2): qty 3

## 2024-04-21 MED ORDER — INSULIN ASPART 100 UNIT/ML IJ SOLN: 0.0000 [IU] | Freq: Three times a day (TID) | INTRAMUSCULAR | Status: DC

## 2024-04-21 MED ORDER — IOHEXOL 9 MG/ML PO SOLN
500.0000 mL | ORAL | Status: AC
  Administered 2024-04-21 (×2): 500 mL via ORAL

## 2024-04-21 MED ORDER — IOHEXOL 300 MG/ML  SOLN
100.0000 mL | Freq: Once | INTRAMUSCULAR | Status: AC | PRN
  Administered 2024-04-21: 100 mL via INTRAVENOUS

## 2024-04-21 MED ORDER — SODIUM CHLORIDE 0.9 % IV SOLN
3.0000 g | Freq: Four times a day (QID) | INTRAVENOUS | Status: AC
  Administered 2024-04-21 – 2024-04-22 (×6): 3 g via INTRAVENOUS
  Filled 2024-04-21 (×7): qty 8

## 2024-04-21 NOTE — Progress Notes (Signed)
 " PROGRESS NOTE    Jessica Luna  FMW:[id number] DOB: Dec 28, 1955 DOA: 04/16/2024 PCP: Fernand Sigrid HERO, MD  108A/108A-AA  LOS: 5 days   Brief hospital course:   Assessment & Plan: Jessica Luna is a 69 y.o. female  being admitted with DKA / HHS / New onset diabetes possibly steroid shot precipitated. Aic was 5.6 three months ago.  She was brought in by EMS with altered mental status/increased somnolence following a daughter-requested wellness check, due to concern that patient's caregiver was unable to make it to her home for several days due to the snow and ice.   Her Past medical history is significant for Schizophrenia, granulosa cell tumor left ovary 2019 (s/p hysterectomy/bilateral salpingo oophorectomy), complicated by vesicovaginal fistula, prior documentation of alcohol use disorder / alcoholic liver disease /  chronic hepatitis C and HTN. Chart review reveals that patient had been complaining about right hip and leg pain for the past two months with an ED visit 03/05/2024 (treated for cystitis) with PCP follow-up visit 03/22/2023, when she received an IM Depo-Medrol shot with an orthopedic referral.   DKA  New onset diabetic HbA1c  5.21 December 2023, was 6.0 in 2023-> likely was prediabetic Received IM steroid shot 03/21/24 for right lumbar radiculopathy Glucose 1188 with anion gap 25, bicarb 19, beta hydroxy 6.98 and venous pH 7.22, serum osmolality 347 --off insulin  gtt on 2/1 --cont glargine 40u daily --ACHS and SSI -Will need diabetic educator consult  Acute metabolic encephalopathy, improved --2/2 DKA  AKI (acute kidney injury) Creatinine 1.65, baseline 1.1 12/2023 --s/p MIVF --oral hydration now  Hyperkalemia Potassium 6.9, treated with calcium  gluconate No EKG changes related to hyperkalemia --resolved with insulin  gtt  Lactic acidosis --due to DKA  Anaerobic bacteremia  --likely clostridium --repeat blood cx --start Unasyn  for now --ID consult --CT a/p w  contrast    Right pelvic and leg pain History of 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 infection vs fistula, see report Will also get bilateral lower extremity venous ultrasound in view of history of low-grade malignancy--->negative   Urinary incontinence History of post hysterectomy vesicovaginal fistula Urinalysis clean No acute issues suspected Will benefit from outpatient follow-up with urology/gynecology  History of alcoholic liver disease History of alcohol use disorder Chronic hepatitis C Mild LFT elevation with AST/ALT 52/50, improved from 96/67 in October 2025 No acute issues suspected Will get baseline EtOH as well as UDS  Schizophrenia, chronic condition (HCC)  Essential hypertension BP was low with EMS --hold antihypertensives  Obesity class I, 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  Pt declined SNF rehab Anticipated d/c date is: 2-3 days   Subjective and Interval History:  Pt had no complaints.  Pt said she wanted to go home.   Objective: Vitals:   04/21/24 0326 04/21/24 0743 04/21/24 1558 04/21/24 1956  BP: 124/60 129/62 132/84 136/70  Pulse: (!) 106 (!) 101 (!) 107 99  Resp: 19 17 16    Temp: 98.2 F (36.8 C) 98.2 F (36.8 C) 99.2 F (  37.3 C) 98.4 F (36.9 C)  TempSrc: Axillary  Oral Oral  SpO2: 91% 90% 97% 99%  Weight:      Height:        Intake/Output Summary (Last 24 hours) at 04/21/2024 2000 Last data filed at 04/21/2024 1830 Gross  per 24 hour  Intake 340 ml  Output 300 ml  Net 40 ml   Filed Weights   04/16/24 1818  Weight: 80.3 kg    Examination:   Constitutional: NAD, alert, oriented to person and place HEENT: conjunctivae and lids normal, EOMI CV: No cyanosis.   RESP: normal respiratory effort, on RA   Data Reviewed: I have personally reviewed labs and imaging studies  Time spent: 35 minutes  Ellouise Haber, MD [facility] If 7PM-7AM, please contact night-coverage 04/21/2024, 8:00 PM   "

## 2024-04-21 NOTE — TOC Progression Note (Signed)
 Transition of Care [facility]) - Progression Note    Patient Details  Name: Jessica Luna MRN: [id number] Date of Birth: 17-Aug-1955  [facility]) CM/SW Contact  Jessica JAYSON Perfect, RN Phone Number: 04/21/2024, 3:14 PM  Clinical Narrative:   RNCM met with patient in her room.  She continues to state she is not going to a rehab facility and going home.  She is in agreement with home health PT/RN/OT.  She also receives daily care from CNA/PCA with holistic care and resources with [facility].  PASRR obtained today just in case patient agreeed for facility PASRR number [id number] E 2/5/2/7.  RNCM spoke with daughter, Jessica Luna, she is in agreement with this plan and RN will continue to follow until discharge.     Expected Discharge Plan: Home w Home Health Services Barriers to Discharge: Continued Medical Work up               Expected Discharge Plan and Services   Discharge Planning Services: CM Consult   Living arrangements for the past 2 months: Single Family Home                           HH Arranged: RN, Disease Management, PT, OT HH Agency:  (referrals sent out)         Social Drivers of Health (SDOH) Interventions SDOH Screenings   Food Insecurity: Patient Unable To Answer (04/18/2024)  Housing: Unknown (04/18/2024)  Transportation Needs: Patient Unable To Answer (04/18/2024)  Utilities: Patient Unable To Answer (04/18/2024)  Financial Resource Strain: Low Risk  (03/21/2024)   Received from [facility]  Social Connections: Patient Unable To Answer (04/18/2024)  Tobacco Use: High Risk (04/18/2024)    Readmission Risk Interventions     No data to display

## 2024-04-21 NOTE — Inpatient Diabetes Management (Addendum)
 Inpatient Diabetes Program Recommendations  AACE/ADA: New Consensus Statement on Inpatient Glycemic Control (2015)  Target Ranges:  Prepandial:   less than 140 mg/dL      Peak postprandial:   less than 180 mg/dL (1-2 hours)      Critically ill patients:  140 - 180 mg/dL    Latest Reference Range & Units 04/16/24 18:28  Glucose 70 - 99 mg/dL 8,811 (HH)  (HH): Data is critically high  Latest Reference Range & Units 04/16/24 23:40  Hemoglobin A1C 4.8 - 5.6 % 13.8 (H)  349 mg/dl  (H): Data is abnormally high  Latest Reference Range & Units 04/20/24 08:59 04/20/24 12:40 04/20/24 15:22 04/20/24 19:39 04/20/24 21:02  Glucose-Capillary 70 - 99 mg/dL 779 (H)  7 units Novolog   286 (H)  11 units Novolog   40 units Semglee   199 (H)  4 units Novolog   203 (H) 204 (H)    Latest Reference Range & Units 04/21/24 07:42 04/21/24 12:16  Glucose-Capillary 70 - 99 mg/dL 872 (H)  3 units Novolog   40 units Semglee  231 (H)  (H): Data is abnormally high  Diabetes history: PreDM (per PCP note on 12/31/23)  New Diagnosis Diabetes/ DKA  Current Orders: Semglee  40 units daily Novolog  Resistant Correction Scale/ SSI (0-20 units) TID AC   Pt refusing to go to SNF for Rehab [facility] team called Daughter Jessica Luna, and Dtr in agreement pt needs Rehab   Dtr will try to get pt to agree to Rehab at SNF   MD- Please consider starting Novolog  Meal Coverage: Novolog  4 units TID with meals HOLD if pt NPO HOLD if pt eats <50% meals  Please keep discharge regimen simple.  Maybe Once Daily Basal Insulin  + Oral medication like Metformin  BID.  Dtr told me she will try to go see her mom (the pt) once a day and help her administer the Insulin  injection    Addendum 12pm--Went to see pt.  She was lying in bed on her side but rolled over and acknowledged my presence in her room.  I attempted to try to teach pt how to use the insulin  pen, but pt kept telling me she knows how to give insulin  already and gave insulin  to  her mom.  Pt did not know the date nor the year when I asked.  She then told me she was sleepy from eating lunch and wanted to rest and pulled the covers up and rolled over.    I called pt's Daughter (Dtr) Jessica Luna and spoke with her on the phone.  Ms Padin told me it took her days to finally get pt to agree to go to the hospital for treatment.  Reviewed with Dtr pt's admission glucose, treatment for DKA, transition to SQ Insulin , current A1c, new diagnosis diabetes (bais info).  Dtr Jessica Luna is a CNA and has some medical training.  Would be willing to help pt with one injection basal insulin  per day and with other meds.  Dtr is also familiar with checking CBGs as she does this at her job.  Dtr agreeable to come to hospital tomorrow AM (02/06) and meet with the Diabetes RN to learn how to use insulin  pen.  Dtr stated she has seen other people use the pen but would need someone to show her tomorrow.  Asked Dtr to please alert the bedside RN when she arrives tomorrow so the RN can contact the Diabetes RN to come up and show her the insulin  pen and answer any other additional  DM questions.     --Will follow patient during hospitalization--  Adina Rudolpho Arrow RN, MSN, CDCES Diabetes Coordinator Inpatient Glycemic Control Team Team Pager: [phone] (8a-5p)

## 2024-04-21 NOTE — Plan of Care (Signed)

## 2024-04-21 NOTE — Progress Notes (Signed)
 Physical Therapy Treatment Patient Details Name: Jessica Luna MRN: [id number] DOB: 1955/12/30 Today's Date: 04/21/2024   History of Present Illness Patient is a 69 year old female with AMS, increased somnolence. New onset diabetic with hyperosmolar hyperglycemic state. 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  Pt performed transfers well, but noted to have soiled linens.  Nurse tech notified while pt was upright in standing position in order to change linens while pt was out of the bed.  Pt was able to ambulate well with the use of the walker within the room, albeit slow and purposeful when performing.  Pt was able to ambulate outside of the room to the end of her small hallway before feeling like she needed to return to the room.  Linens were changed by therapist and nurse tech and pt was left with all needs met and call bell within reach.  Pt ultimately has made improvements, however due to the living situation that the pt describes, not sure if pt would be able to navigate stairs at this time to her apartment.      If plan is discharge home, recommend the following: A lot of help with walking and/or transfers;A little help with bathing/dressing/bathroom;Assistance with cooking/housework;Assist for transportation;Help with stairs or ramp for entrance;Supervision due to cognitive status   Can travel by private vehicle     No  Equipment Recommendations       Recommendations for Other Services       Precautions / Restrictions Precautions Precautions: Fall Recall of Precautions/Restrictions: Impaired Restrictions Weight Bearing Restrictions Per Provider Order: No     Mobility  Bed Mobility Overal bed mobility: Needs Assistance Bed Mobility: Supine to Sit           General bed mobility  comments: Pt able to utilze bed features to come upright into seated position    Transfers Overall transfer level: Needs assistance Equipment used: Rolling walker (2 wheels) Transfers: Sit to/from Stand Sit to Stand: Min assist           General transfer comment: cues for technique and taking good strides within the walker when ambulating outside of the room.    Ambulation/Gait Ambulation/Gait assistance: Contact guard assist Gait Distance (Feet): 40 Feet Assistive device: Rolling walker (2 wheels) Gait Pattern/deviations: Step-through pattern, Decreased step length - left, Decreased stance time - right Gait velocity: decreased     General Gait Details: unable to due to weakness and reported leg pain   Stairs             Wheelchair Mobility     Tilt Bed    Modified Rankin (Stroke Patients Only)       Balance Overall balance assessment: Needs assistance Sitting-balance support: Feet supported Sitting balance-Leahy Scale: Fair     Standing balance support: Bilateral upper extremity supported, Reliant on assistive device for balance, During functional activity Standing balance-Leahy Scale: Fair                              Communication    Cognition Arousal: Alert Behavior During Therapy: WFL for tasks assessed/performed                             Following  commands: Impaired Following commands impaired: Follows one step commands with increased time    Cueing    Exercises      General Comments        Pertinent Vitals/Pain Pain Assessment Pain Assessment: No/denies pain    Home Living                          Prior Function            PT Goals (current goals can now be found in the care plan section) Acute Rehab PT Goals Patient Stated Goal: to go home PT Goal Formulation: With patient Time For Goal Achievement: 05/04/24 Potential to Achieve Goals: Fair Progress towards PT goals: Progressing toward  goals    Frequency    Min 2X/week      PT Plan      Co-evaluation              AM-PAC PT 6 Clicks Mobility   Outcome Measure  Help needed turning from your back to your side while in a flat bed without using bedrails?: A Little Help needed moving from lying on your back to sitting on the side of a flat bed without using bedrails?: A Little Help needed moving to and from a bed to a chair (including a wheelchair)?: A Little Help needed standing up from a chair using your arms (e.g., wheelchair or bedside chair)?: A Little Help needed to walk in hospital room?: A Lot Help needed climbing 3-5 steps with a railing? : A Lot 6 Click Score: 16    End of Session Equipment Utilized During Treatment: Gait belt Activity Tolerance: Patient limited by fatigue Patient left: in chair;with call bell/phone within reach;with chair alarm set;with nursing/sitter in room Nurse Communication: Mobility status PT Visit Diagnosis: Muscle weakness (generalized) (M62.81);Unsteadiness on feet (R26.81)     Time: 0312-0328 PT Time Calculation (min) (ACUTE ONLY): 16 min  Charges:    $Therapeutic Activity: 8-22 mins PT General Charges $$ ACUTE PT VISIT: 1 Visit                     Fonda Simpers, PT, DPT Physical Therapist - [place]  [facility]  04/21/24, 3:56 PM

## 2024-04-21 NOTE — Consult Note (Addendum)
 NAME: Jessica Luna  DOB: 05-10-1955  MRN: [id number]  Date/Time: 04/21/2024 7:09 PM  REQUESTING PROVIDER: Dr.Lai Subjective:  REASON FOR CONSULT: Clostridium bacteremia ? Jessica Luna is a 69 y.o. 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 Then was seen in [facility] and got kenalog injection to the rt hip area on 03/21/24 There was not much improvement She was brought to the ED on 04/16/24  04/16/24 18:17  BP 144/78 !  Temp 97.9 F (36.6 C)  Pulse Rate 107 !  Resp 23 !  SpO2 99 %    Latest Reference Range & Units 04/16/24 18:28  WBC 4.0 - 10.5 K/uL 9.1  Hemoglobin 12.0 - 15.0 g/dL 88.4 (L)  HCT 63.9 - 53.9 % 37.2  Platelets 150 - 400 K/uL 103 (L)  Creatinine 0.44 - 1.00 mg/dL 8.34 (H)   Blood sugar was 1188 K 6.9 Na 126 Co2 was 19 Found to be in DKA Corrected Blood culture sent and on 2/4 came back as anerobic bottle gram positive rod Today verbal report is clostridium- lab is working on the organism I am asked to see the patient fir the bacteremia Pt states she drinks one 40 ounce beer every day  Past Medical History:  Diagnosis Date   Hypertension    Schizophrenia (HCC)     Past Surgical History:  Procedure Laterality Date   CESAREAN SECTION     TAH BSO omentectomy Social History   Socioeconomic History   Marital status: Widowed    Spouse name: Not on file   Number of children: Not on file   Years of education: Not on file   Highest education level: Not on file  Occupational History   Not on file  Tobacco Use   Smoking status: Every Day   Smokeless tobacco: Never  Substance and Sexual Activity   Alcohol use: Yes   Drug use: Not on file    Sexual activity: Not Currently    Birth control/protection: Post-menopausal  Other Topics Concern   Not on file  Social History Narrative   Not on file   Social Drivers of Health   Tobacco Use: High Risk (04/18/2024)   Patient History    Smoking Tobacco Use: Every Day    Smokeless Tobacco Use: Never    Passive Exposure: Not on file  Financial Resource Strain: Low Risk  (03/21/2024)   Received from [facility]   Overall Financial Resource Strain (CARDIA)    Difficulty of Paying Living Expenses: Not hard at all  Food Insecurity: Patient Unable To Answer (04/18/2024)   Epic    Worried About Programme Researcher, Broadcasting/film/video in the Last Year: Patient unable to answer    Ran Out of Food in the Last Year: Patient unable to answer  Transportation Needs: Patient Unable To Answer (04/18/2024)   Epic    Lack of Transportation (Medical): Patient unable to answer    Lack of Transportation (Non-Medical): Patient unable to answer  Physical Activity: Not on file  Stress: Not on file  Social Connections: Patient Unable To Answer (04/18/2024)   Social Connection and Isolation Panel    Frequency of  Communication with Friends and Family: Patient unable to answer    Frequency of Social Gatherings with Friends and Family: Patient unable to answer    Attends Religious Services: Patient unable to answer    Active Member of Clubs or Organizations: Patient unable to answer    Attends Banker Meetings: Patient unable to answer    Marital Status: Patient unable to answer  Intimate Partner Violence: Patient Unable To Answer (04/18/2024)   Epic    Fear of Current or Ex-Partner: Patient unable to answer    Emotionally Abused: Patient unable to answer    Physically Abused: Patient unable to answer    Sexually Abused: Patient unable to answer  Depression (EYV7-0): Not on file  Alcohol Screen: Not on file  Housing: Unknown (04/18/2024)   Epic    Unable to Pay for Housing in the Last Year: Not on file     Number of Times Moved in the Last Year: 0    Homeless in the Last Year: Not on file  Utilities: Patient Unable To Answer (04/18/2024)   Epic    Threatened with loss of utilities: Patient unable to answer  Health Literacy: Not on file    Family History  Problem Relation Age of Onset   Diabetes Father    Allergies[1] I? Current Facility-Administered Medications  Medication Dose Route Frequency Provider Last Rate Last Admin   acetaminophen  (TYLENOL ) tablet 650 mg  650 mg Oral Q6H PRN Foust, Katy L, NP   650 mg at 04/20/24 0933   Ampicillin -Sulbactam (UNASYN ) 3 g in sodium chloride  0.9 % 100 mL IVPB  3 g Intravenous Q6H Fayette Bodily, MD   Stopped at 04/21/24 1757   dextrose  50 % solution 0-50 mL  0-50 mL Intravenous PRN Tan, Ting Xu, MD       enoxaparin  (LOVENOX ) injection 40 mg  0.5 mg/kg Subcutaneous Q24H Duncan, Hazel V, MD   40 mg at 04/20/24 2140   hydrOXYzine  (ATARAX ) tablet 25 mg  25 mg Oral TID PRN Duncan, Hazel V, MD   25 mg at 04/20/24 2141   insulin  aspart (novoLOG ) injection 0-9 Units  0-9 Units Subcutaneous TID WC Awanda City, MD   7 Units at 04/21/24 1718   insulin  glargine-yfgn injection 40 Units  40 Units Subcutaneous Daily Awanda City, MD   40 Units at 04/21/24 1033   iohexol  (OMNIPAQUE ) 9 MG/ML oral solution 500 mL  500 mL Oral Q1H Awanda City, MD       melatonin tablet 5 mg  5 mg Oral QHS PRN Foust, Katy L, NP   5 mg at 04/19/24 2203   nicotine  (NICODERM CQ  - dosed in mg/24 hours) patch 14 mg  14 mg Transdermal Daily Foust, Katy L, NP   14 mg at 04/21/24 9070   nicotine  polacrilex (NICORETTE ) gum 2 mg  2 mg Oral PRN Foust, Katy L, NP         Abtx:  Anti-infectives (From admission, onward)    Start     Dose/Rate Route Frequency Ordered Stop   04/21/24 1730  Ampicillin -Sulbactam (UNASYN ) 3 g in sodium chloride  0.9 % 100 mL IVPB        3 g 200 mL/hr over 30 Minutes Intravenous Every 6 hours 04/21/24 1633     04/18/24 2300  vancomycin  (VANCOREADY) IVPB 1500 mg/300  mL  Status:  Discontinued        1,500 mg 150 mL/hr over 120 Minutes Intravenous Every 48 hours 04/16/24 2214 04/17/24 0831  04/17/24 0900  ceFEPIme  (MAXIPIME ) 2 g in sodium chloride  0.9 % 100 mL IVPB  Status:  Discontinued        2 g 200 mL/hr over 30 Minutes Intravenous Every 12 hours 04/16/24 2212 04/17/24 0831   04/16/24 2215  metroNIDAZOLE  (FLAGYL ) IVPB 500 mg  Status:  Discontinued        500 mg 100 mL/hr over 60 Minutes Intravenous Every 12 hours 04/16/24 2207 04/17/24 0831   04/16/24 2215  vancomycin  (VANCOREADY) IVPB 2000 mg/400 mL        2,000 mg 200 mL/hr over 120 Minutes Intravenous  Once 04/16/24 2212 04/17/24 0258   04/16/24 2030  ceFEPIme  (MAXIPIME ) 2 g in sodium chloride  0.9 % 100 mL IVPB        2 g 200 mL/hr over 30 Minutes Intravenous  Once 04/16/24 2023 04/16/24 2147   04/16/24 2030  metroNIDAZOLE  (FLAGYL ) IVPB 500 mg  Status:  Discontinued        500 mg 100 mL/hr over 60 Minutes Intravenous  Once 04/16/24 2023 04/16/24 2207   04/16/24 2030  vancomycin  (VANCOCIN ) IVPB 1000 mg/200 mL premix  Status:  Discontinued        1,000 mg 200 mL/hr over 60 Minutes Intravenous  Once 04/16/24 2023 04/16/24 2212       REVIEW OF SYSTEMS:  Const: negative fever, negative chills,some  weight loss Eyes: negative diplopia or visual changes, negative eye pain ENT: negative coryza, negative sore throat Resp: negative cough, hemoptysis, dyspnea Cards: negative for chest pain, palpitations, lower extremity edema GU: negative for frequency, dysuria and hematuria GI: Negative for abdominal pain, diarrhea, bleeding, constipation Skin: negative for rash and pruritus Heme: negative for easy bruising and gum/nose bleeding MS: rt hip and leg pain  weakness Neurolo:negative for headaches, dizziness, vertigo, memory problems  Psych: negative for feelings of anxiety, depression  Endocrine:pre diabetes Allergy/Immunology- codeine Objective:  VITALS:  BP 132/84 (BP Location: Right Arm)    Pulse (!) 107   Temp 99.2 F (37.3 C) (Oral)   Resp 16   Ht 5' 4 (1.626 m)   Wt 80.3 kg   SpO2 97%   BMI 30.39 kg/m   PHYSICAL EXAM:  General: Awake, chronically ill, pale conjunctiva Head: Normocephalic, without obvious abnormality, atraumatic. Eyes: Conjunctivae clear, anicteric sclerae. Pupils are equal ENT Nares normal. No drainage or sinus tenderness. Lips, mucosa, and tongue normal. No Thrush Poor dentition Neck: Supple, symmetrical, no adenopathy, thyroid : non tender no carotid bruit and no JVD. Back: No CVA tenderness. Lungs: b/l air entry decreased bases Heart: Regular rate and rhythm, no murmur, rub or gallop. Abdomen: Soft,  distended. Lap scar Extremities: atraumatic, no cyanosis. No edema. No clubbing Skin: No rashes or lesions. Or bruising Lymph: Cervical, supraclavicular normal. Neurologic: Grossly non-focal Pertinent Labs Lab Results CBC    Component Value Date/Time   WBC 8.1 04/18/2024 0429   RBC 3.73 (L) 04/18/2024 0429   HGB 11.8 (L) 04/18/2024 0429   HGB 14.3 10/04/2011 0217   HCT 36.6 04/18/2024 0429   HCT 44.8 10/04/2011 0217   PLT 84 (L) 04/18/2024 0429   PLT 205 10/04/2011 0217   MCV 98.1 04/18/2024 0429   MCV 96 10/04/2011 0217   MCH 31.6 04/18/2024 0429   MCHC 32.2 04/18/2024 0429   RDW 13.2 04/18/2024 0429   RDW 13.5 10/04/2011 0217   LYMPHSABS 1.2 04/16/2024 1828   MONOABS 0.9 04/16/2024 1828   EOSABS 0.0 04/16/2024 1828   BASOSABS 0.0 04/16/2024 1828  Latest Ref Rng & Units 04/21/2024    4:43 AM 04/18/2024    4:29 AM 04/17/2024    9:42 AM  CMP  Glucose 70 - 99 mg/dL  699  861   BUN 8 - 23 mg/dL  14  9   Creatinine 9.55 - 1.00 mg/dL 9.21  9.05  9.04   Sodium 135 - 145 mmol/L  136  141   Potassium 3.5 - 5.1 mmol/L  4.0  3.4   Chloride 98 - 111 mmol/L  103  105   CO2 22 - 32 mmol/L  24  27   Calcium  8.9 - 10.3 mg/dL  8.3  8.3       Microbiology: Recent Results (from the past 240 hours)  Culture, blood (routine x 2)      Status: None   Collection Time: 04/16/24  6:36 PM   Specimen: BLOOD  Result Value Ref Range Status   Specimen Description BLOOD RIGHT ANTECUBITAL  Final   Special Requests   Final    BOTTLES DRAWN AEROBIC AND ANAEROBIC Blood Culture adequate volume   Culture   Final    NO GROWTH 5 DAYS Performed at [facility], [address], [place], [place] [address]    Report Status 04/21/2024 FINAL  Final  Culture, blood (routine x 2)     Status: None (Preliminary result)   Collection Time: 04/16/24  7:32 PM   Specimen: BLOOD  Result Value Ref Range Status   Specimen Description   Final    BLOOD BLOOD RIGHT ARM Performed at [facility], [address], [place], [place] [address]    Special Requests   Final    BOTTLES DRAWN AEROBIC AND ANAEROBIC Blood Culture adequate volume Performed at [facility], [address] [place], [place] [address]    Culture  Setup Time   Final    GRAM POSITIVE RODS ANAEROBIC BOTTLE ONLY CRITICAL RESULT CALLED TO, READ BACK BY AND VERIFIED WITH: WILL LENON 04/19/24 0957 MW Performed at [facility], [address] [place], [place] [address]    Culture   Final    CULTURE REINCUBATED FOR BETTER GROWTH Performed at [facility], [address] [place], [place] [address]    Report Status PENDING  Incomplete  Resp panel by RT-PCR (RSV, Flu A&B, Covid) Anterior Nasal Swab     Status: None   Collection Time: 04/16/24 11:40 PM   Specimen: Anterior Nasal Swab  Result Value Ref Range Status   SARS Coronavirus 2 by RT PCR NEGATIVE NEGATIVE Final    Comment: (NOTE) SARS-CoV-2 target nucleic acids are NOT DETECTED.  The SARS-CoV-2 RNA is generally detectable in upper respiratory specimens during the acute phase of infection. The lowest concentration of SARS-CoV-2 viral copies this assay can detect is 138 copies/mL. A negative result does not preclude SARS-Cov-2 infection and should not be used as the sole basis for  treatment or other patient management decisions. A negative result may occur with  improper specimen collection/handling, submission of specimen other than nasopharyngeal swab, presence of viral mutation(s) within the areas targeted by this assay, and inadequate number of viral copies(<138 copies/mL). A negative result must be combined with clinical observations, patient history, and epidemiological information. The expected result is Negative.  Fact Sheet for Patients:  bloggercourse.com  Fact Sheet for Healthcare Providers:  seriousbroker.it  This test is no t yet approved or cleared by the [place]  FDA and  has been authorized for detection and/or  diagnosis of SARS-CoV-2 by FDA under an Emergency Use Authorization (EUA). This EUA will remain  in effect (meaning this test can be used) for the duration of the COVID-19 declaration under Section 564(b)(1) of the Act, 21 U.S.C.section 360bbb-3(b)(1), unless the authorization is terminated  or revoked sooner.       Influenza A by PCR NEGATIVE NEGATIVE Final   Influenza B by PCR NEGATIVE NEGATIVE Final    Comment: (NOTE) The Xpert Xpress SARS-CoV-2/FLU/RSV plus assay is intended as an aid in the diagnosis of influenza from Nasopharyngeal swab specimens and should not be used as a sole basis for treatment. Nasal washings and aspirates are unacceptable for Xpert Xpress SARS-CoV-2/FLU/RSV testing.  Fact Sheet for Patients: bloggercourse.com  Fact Sheet for Healthcare Providers: seriousbroker.it  This test is not yet approved or cleared by the [place]  FDA and has been authorized for detection and/or diagnosis of SARS-CoV-2 by FDA under an Emergency Use Authorization (EUA). This EUA will remain in effect (meaning this test can be used) for the duration of the COVID-19 declaration under Section 564(b)(1) of the Act, 21  U.S.C. section 360bbb-3(b)(1), unless the authorization is terminated or revoked.     Resp Syncytial Virus by PCR NEGATIVE NEGATIVE Final    Comment: (NOTE) Fact Sheet for Patients: bloggercourse.com  Fact Sheet for Healthcare Providers: seriousbroker.it  This test is not yet approved or cleared by the [place]  FDA and has been authorized for detection and/or diagnosis of SARS-CoV-2 by FDA under an Emergency Use Authorization (EUA). This EUA will remain in effect (meaning this test can be used) for the duration of the COVID-19 declaration under Section 564(b)(1) of the Act, 21 U.S.C. section 360bbb-3(b)(1), unless the authorization is terminated or revoked.  Performed at [facility], [address] [place], [place] [address]     Lines and Device Date on insertion # of days DC  Central line     Foley     ETT       IMAGING RESULTS: CT head 04/16/24 No acute findings CT abdomen and pelvis without contrast- small amount of intravesical gas  CXR no infiltrate  I have personally reviewed the films   Patient has: []  acute illness w/systemic sxs  [mod] [x]  illness posing risk to life or function  [high]  I reviewed:  (3+) [x]  primary team note []  consultant note(s) []  procedure/op note(s) []  micro result(s)   [x]  CBC results [x]  chemistry results [x]  radiology report(s) []  nursing note(s)  I independently visualized:  (any)   []  cxs/plates in lab []  plain film images [x]  CT images []  PET images   []  path slide(s) []  ECG tracing []  MRI images []  nuclear scan  I discussed: (any) []  micro and/or path w/lab personnel [x]  drug options and/or interactions w/ID pharmD   []  procedure/OR findings w/other MD(s) []  echo and/or imaging w/other MD(s)   [x]  mgm't w/attending(s) involved in case []  setting up home abx w/OPAT team  Mgm't requires: []  prescription drug(s)  [mod] [x]  intensive toxicity monitoring  [high]   ? Impression/Recommendation ?69 y.o. 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 She does not have fever or leucocytosis The source could be GI tract  It could very well be transient bacteremia from the gut because of DKA, or Genito urinary ( though she has no uterus ) She has h/o ovarian ca s/p TAH BSO Will repeat blood culture Will start unasyn  for now  AKI- resolved  Schizophrenia gets monthly injection invega  Vesicovaginal fistula post surgery 2019  Liver disease Hepatitis C- RNA in 2022 was 1.5 million Was never treated She consumes alcohol every day  ( 40 ounce beer)   Recommend CT abd/pelvis with contrast to look for any abscess, to define the internal organs better including diverticulosis, VVF  Rt hip pain since 2 months Received steroids- Cxr Okay before Would recommend MRI of the Pelvis, lumbar spine and rt hip   Discussed the management with patient Dr.Lai and ID pharmacist ? This consult involved complex infectious pathology and antimicrobial management  ________________________________________________      [1]  Allergies Allergen Reactions   Codeine Nausea And Vomiting

## 2024-04-21 NOTE — Plan of Care (Signed)
" °  Problem: Coping: Goal: Ability to adjust to condition or change in health will improve Outcome: Progressing   Problem: Metabolic: Goal: Ability to maintain appropriate glucose levels will improve Outcome: Progressing   Problem: Skin Integrity: Goal: Risk for impaired skin integrity will decrease Outcome: Progressing   Problem: Education: Goal: Ability to describe self-care measures that may prevent or decrease complications (Diabetes Survival Skills Education) will improve Outcome: Not Progressing   "

## 2024-04-22 ENCOUNTER — Other Ambulatory Visit (HOSPITAL_COMMUNITY): Payer: Self-pay

## 2024-04-22 ENCOUNTER — Telehealth (HOSPITAL_COMMUNITY): Payer: Self-pay

## 2024-04-22 LAB — CULTURE, BLOOD (ROUTINE X 2)
Culture  Setup Time: NO GROWTH
Culture: NO GROWTH
Culture: NO GROWTH
Special Requests: ADEQUATE
Special Requests: ADEQUATE
Special Requests: ADEQUATE

## 2024-04-22 LAB — GLUCOSE, CAPILLARY
Glucose-Capillary: 194 mg/dL — ABNORMAL HIGH (ref 70–99)
Glucose-Capillary: 152 mg/dL — ABNORMAL HIGH (ref 70–99)
Glucose-Capillary: 247 mg/dL — ABNORMAL HIGH (ref 70–99)
Glucose-Capillary: 187 mg/dL — ABNORMAL HIGH (ref 70–99)
Glucose-Capillary: 147 mg/dL — ABNORMAL HIGH (ref 70–99)

## 2024-04-22 MED ORDER — METFORMIN HCL ER 500 MG PO TB24: 1000.0000 mg | ORAL_TABLET | Freq: Every day | ORAL | Status: DC

## 2024-04-22 NOTE — Telephone Encounter (Signed)
 Pharmacy Patient Advocate Encounter  Insurance verification completed.    The patient is insured through [facility]. Patient has Medicare and is not eligible for a copay card, but may be able to apply for patient assistance or Medicare RX Payment Plan (Patient Must reach out to their plan, if eligible for payment plan), if available.    Ran test claim for Novolog  100unit pen and the current 30 day co-pay is $4.90.  Ran test claim for Lantus  100unit pen and the current 30 day co-pay is $4.90.  This test claim was processed through [facility]- copay amounts may vary at other pharmacies due to pharmacy/plan contracts, or as the patient moves through the different stages of their insurance plan.

## 2024-04-22 NOTE — Progress Notes (Signed)
 PT Cancellation Note  Patient Details Name: Jessica Luna MRN: [id number] DOB: September 28, 1955   Cancelled Treatment:     Pt sleeping with blanket over head, declined PT session. Continue per POC.    Darice JAYSON Bohr 04/22/2024, 4:10 PM

## 2024-04-22 NOTE — Progress Notes (Signed)
 " PROGRESS NOTE    Jessica Luna  FMW:[id number] DOB: 1955-03-20 DOA: 04/16/2024 PCP: Fernand Sigrid HERO, MD  108A/108A-AA  LOS: 6 days   Brief hospital course:   Assessment & Plan: Jessica Luna is a 69 y.o. female  being admitted with DKA / HHS / New onset diabetes possibly steroid shot precipitated. Aic was 5.6 three months ago.  She was brought in by EMS with altered mental status/increased somnolence following a daughter-requested wellness check, due to concern that patient's caregiver was unable to make it to her home for several days due to the snow and ice.   Her Past medical history is significant for Schizophrenia, granulosa cell tumor left ovary 2019 (s/p hysterectomy/bilateral salpingo oophorectomy), complicated by vesicovaginal fistula, prior documentation of alcohol use disorder / alcoholic liver disease /  chronic hepatitis C and HTN. Chart review reveals that patient had been complaining about right hip and leg pain for the past two months with an ED visit 03/05/2024 (treated for cystitis) with PCP follow-up visit 03/22/2023, when she received an IM Depo-Medrol shot with an orthopedic referral.   DKA  New onset diabetic HbA1c 5.21 December 2023, was 6.0 in 2023-> likely was prediabetic Received IM steroid shot 03/21/24 for right lumbar radiculopathy Glucose 1188 with anion gap 25, bicarb 19, beta hydroxy 6.98 and venous pH 7.22, serum osmolality 347 --off insulin  gtt on 2/1 --cont glargine 40u daily --ACHS and SSI --will discharge on once daily glargine for ease of administration.  Daughter or caregiver will likely need to administer insulin  for the pt.  Anaerobic bacteremia  Concern for colon cancer --likely clostridium --ID consulted, started Unasyn   --CT a/p w contrast showed Short segment irregular annular mural thickening within the proximal transverse colon, highly concerning for colonic neoplasm.  This is likely the cause of pt's bacteremia. --cont Unasyn  pending ID  rec --f/u repeat blood cx --outpatient Colonoscopy is recommended for further evaluation.  Acute metabolic encephalopathy, improved --2/2 DKA  AKI (acute kidney injury) Creatinine 1.65, baseline 1.1 12/2023 --s/p MIVF --oral hydration now  Hyperkalemia Potassium 6.9, treated with calcium  gluconate No EKG changes related to hyperkalemia --resolved with insulin  gtt  Lactic acidosis --due to DKA  Right pelvic and leg pain History of granulosa cell tumor left ovary s/p hysterectomy and bilateral salpingo-oophorectomy 2019 History of postoperative vesicovaginal fistula Treated in the ED December for cystitis Treated 3 weeks ago at PCP office with a Depo-Medrol shot Urinalysis clean.  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     DVT prophylaxis: Lovenox  SQ Code Status: Full code  Family Communication: tried calling all 3 family contacts today, no answer Level of care: Med-Surg Dispo:   The patient is from: home Anticipated d/c is to: home.  Pt declined SNF rehab   Subjective and Interval History:  No new complaint.     Objective: Vitals:   04/22/24 0350 04/22/24 0814 04/22/24 1535 04/22/24 2003  BP: 119/66 112/62 129/66 (!) 141/83  Pulse: ROLLEN)  107 99 96 97  Resp: 19 18 18    Temp: 98.8 F (37.1 C) 98.7 F (37.1 C) 98.5 F (36.9 C) 97.8 F (36.6 C)  TempSrc:  Oral Oral Oral   SpO2: 93% 94% 95% 98%  Weight:      Height:        Intake/Output Summary (Last 24 hours) at 04/22/2024 2159 Last data filed at 04/22/2024 1500 Gross per 24 hour  Intake 600 ml  Output 550 ml  Net 50 ml   Filed Weights   04/16/24 1818  Weight: 80.3 kg    Examination:   Constitutional: NAD CV: No cyanosis.   RESP: normal respiratory effort, on RA   Data Reviewed: I have personally reviewed labs and imaging studies  Time spent: 50 minutes  Jessica Haber, MD [facility] If 7PM-7AM, please contact night-coverage 04/22/2024, 9:59 PM   "

## 2024-04-22 NOTE — Plan of Care (Signed)
" °  Problem: Coping: Goal: Ability to adjust to condition or change in health will improve Outcome: Progressing   Problem: Metabolic: Goal: Ability to maintain appropriate glucose levels will improve Outcome: Progressing   Problem: Skin Integrity: Goal: Risk for impaired skin integrity will decrease Outcome: Progressing   Problem: Tissue Perfusion: Goal: Adequacy of tissue perfusion will improve Outcome: Progressing   Problem: Cardiac: Goal: Ability to maintain an adequate cardiac output will improve Outcome: Progressing   Problem: Metabolic: Goal: Ability to maintain appropriate glucose levels will improve Outcome: Progressing   Problem: Fluid Volume: Goal: Ability to achieve a balanced intake and output will improve Outcome: Progressing   Problem: Elimination: Goal: Will not experience complications related to bowel motility Outcome: Progressing   Problem: Safety: Goal: Ability to remain free from injury will improve Outcome: Progressing   Problem: Pain Managment: Goal: General experience of comfort will improve and/or be controlled Outcome: Progressing   "

## 2024-04-22 NOTE — Progress Notes (Signed)
 "  Date of Admission:  04/16/2024    ID: Jessica Luna is a 69 y.o. 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 Then was seen in [facility] and got kenalog injection to the rt hip area on 03/21/24 There was not much improvement presented to the ED with rt leg pain and confusion   Subjective: Pt doing better No pain abdomen Awaiting US    Medications:   enoxaparin  (LOVENOX ) injection  0.5 mg/kg Subcutaneous Q24H   insulin  aspart  0-9 Units Subcutaneous TID WC   insulin  glargine-yfgn  40 Units Subcutaneous Daily   nicotine   14 mg Transdermal Daily    Objective: Vital signs in last 24 hours: Patient Vitals for the past 24 hrs:  BP Temp Temp src Pulse Resp SpO2  04/22/24 2003 (!) 141/83 97.8 F (36.6 C) -- 97 -- 98 %  04/22/24 1535 129/66 98.5 F (36.9 C) Oral 96 18 95 %  04/22/24 0814 112/62 98.7 F (37.1 C) Oral 99 18 94 %  04/22/24 0350 119/66 98.8 F (37.1 C) Oral (!) 107 19 93 %       PHYSICAL EXAM:  General: Alert, cooperative, no distress, chronically ill, icteric looking sclera Head: Normocephalic, without obvious abnormality, atraumatic. ENT Nares normal. No  drainage or sinus tenderness. Lips, mucosa, and tongue normal. No Thrush Neck: Supple, symmetrical, no adenopathy, thyroid : non tender no carotid bruit and no JVD. Back: No CVA tenderness. Lungs: b/l air entry. Heart: Regular rate and rhythm, no murmur, rub or gallop. Abdomen:  distended. Bowel sounds normal. No masses, not tender Extremities: atraumatic, no cyanosis. No edema. No clubbing Skin: No rashes or lesions. Or bruising Lymph: Cervical, supraclavicular normal. Neurologic: Grossly non-focal  Lab Results    Latest Ref Rng & Units 04/18/2024    4:29 AM 04/16/2024    6:28 PM 09/19/2022    6:36 PM  CBC  WBC 4.0 - 10.5 K/uL 8.1  9.1  6.3   Hemoglobin 12.0 - 15.0 g/dL 88.1  88.4  87.7   Hematocrit 36.0 - 46.0 % 36.6  37.2  37.5   Platelets 150 - 400 K/uL 84  103  137        Latest Ref Rng & Units 04/21/2024    4:43 AM 04/18/2024    4:29 AM 04/17/2024    9:42 AM  CMP  Glucose 70 - 99 mg/dL  699  861   BUN 8 - 23 mg/dL  14  9   Creatinine 9.55 - 1.00 mg/dL 9.21  9.05  9.04   Sodium 135 - 145 mmol/L  136  141   Potassium 3.5 - 5.1 mmol/L  4.0  3.4   Chloride 98 - 111 mmol/L  103  105   CO2 22 - 32 mmol/L  24  27   Calcium  8.9 - 10.3 mg/dL  8.3  8.3       Microbiology: 04/16/24 BC 1 anerobic bottle clostridium paraputrificum 04/21/24 repeat BC sent   Studies/Results: CT abd and pelvis with contrast Shows hepatomegaly Distended Gall bladder with GB wall thickening Short segment of annular mural thickening within the proximal transverse colon extending 4.5 cm in length concerning for colonic neoplasm Small volume ascites right foraminal disc extrusion at the L4-5 level, resulting in severe right neural foraminal narrowing       Impression/recommendation 69 y.o. female with a history of Hepatitis C not treated, schizophrenia, HLD, alcoholic liver disease,  prediabetes, TAH/BSO in 2019  with omentectomy for granulosa cell tumor of the ovary, complicated by VVF presented to  the ED with rt leg pain and confusion   New onset DM with DKA, hyperkalemia, hyponatremia and AKI corrected   Clostridium paraputrificum anaerobic bacteremia in 1 set  low bioburden and came positive after 4 days-  CT abdomen and pelvis done yesterday shows transverse colon mural thickening concerning for colon carcinoma This could explain the bacteremia Repeat blood culture sent before starting unasyn  If negative then can do a week of antibiotics- can be PO after 72 hrs of IV ( started 2/5)     AKI- resolved   Schizophrenia gets monthly injection invega   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

## 2024-04-22 NOTE — TOC Progression Note (Signed)
 Transition of Care [facility]) - Progression Note    Patient Details  Name: Jessica Luna MRN: [id number] Date of Birth: Sep 24, 1955  [facility]) CM/SW Contact  Grayce JAYSON Perfect, RN Phone Number: 04/22/2024, 3:42 PM  Clinical Narratitive    Expected Discharge Plan: Home w Home Health Services Barriers to Discharge: Continued Medical Work up               Expected Discharge Plan and Services   Discharge Planning Services: CM Consult   Living arrangements for the past 2 months: Single Family Home                           HH Arranged: RN, Disease Management, PT, OT HH Agency:  (referrals sent out)         Social Drivers of Health (SDOH) Interventions SDOH Screenings   Food Insecurity: Patient Unable To Answer (04/18/2024)  Housing: Unknown (04/18/2024)  Transportation Needs: Patient Unable To Answer (04/18/2024)  Utilities: Patient Unable To Answer (04/18/2024)  Financial Resource Strain: Low Risk  (03/21/2024)   Received from [facility]  Social Connections: Patient Unable To Answer (04/18/2024)  Tobacco Use: High Risk (04/18/2024)    Readmission Risk Interventions     No data to display

## 2024-04-22 NOTE — Plan of Care (Signed)

## 2024-04-22 NOTE — TOC Progression Note (Signed)
 Transition of Care [facility]) - Progression Note    Patient Details  Name: HONESTIE KULIK MRN: [id number] Date of Birth: 05-Nov-1955  Transition of Care [facility]) CM/SW Contact  Grayce JAYSON Perfect, RN Phone Number: 04/22/2024, 3:30 PM  Clinical Narrative:   [facility] accepted her case for RN/PT/OT.  Daughter received insulin  instructions from Diabetes nurse.  Daughter notified of [facility] accepting case.     Expected Discharge Plan: Home w Home Health Services Barriers to Discharge: Continued Medical Work up               Expected Discharge Plan and Services   Discharge Planning Services: CM Consult   Living arrangements for the past 2 months: Single Family Home                           HH Arranged: RN, Disease Management, PT, OT HH Agency:  (referrals sent out)         Social Drivers of Health (SDOH) Interventions SDOH Screenings   Food Insecurity: Patient Unable To Answer (04/18/2024)  Housing: Unknown (04/18/2024)  Transportation Needs: Patient Unable To Answer (04/18/2024)  Utilities: Patient Unable To Answer (04/18/2024)  Financial Resource Strain: Low Risk  (03/21/2024)   Received from [facility]  Social Connections: Patient Unable To Answer (04/18/2024)  Tobacco Use: High Risk (04/18/2024)    Readmission Risk Interventions     No data to display

## 2024-04-22 NOTE — Inpatient Diabetes Management (Addendum)
 Inpatient Diabetes Program Recommendations  AACE/ADA: New Consensus Statement on Inpatient Glycemic Control   Target Ranges:  Prepandial:   less than 140 mg/dL      Peak postprandial:   less than 180 mg/dL (1-2 hours)      Critically ill patients:  140 - 180 mg/dL    Latest Reference Range & Units 04/21/24 07:42 04/21/24 12:16 04/21/24 15:59 04/21/24 19:57 04/21/24 23:47 04/22/24 04:04 04/22/24 08:15  Glucose-Capillary 70 - 99 mg/dL 872 (H) 768 (H) 657 (H) 241 (H) 225 (H) 194 (H) 152 (H)    Latest Reference Range & Units 04/16/24 18:28 04/16/24 20:21 04/16/24 23:40  Beta-Hydroxybutyric Acid 0.05 - 0.27 mmol/L 6.96 (H) 4.72 (H)   Glucose 70 - 99 mg/dL 8,811 (HH) 000 (HH)   Hemoglobin A1C 4.8 - 5.6 %   13.8 (H)   Review of Glycemic Control  Diabetes history: PreDM (per PCP note on 12/31/23) Outpatient Diabetes medications: None Current orders for Inpatient glycemic control: Semglee  40 units daily, Novolog  0-9 units TID with meals   Inpatient Diabetes Program Recommendations:     Oral DM medication: Since plan will be to try to use once a day insulin , please consider ordering Metformin  XR 1000 mg once a day. If postprandial glucose is still consistently over 180 mg/dl with adding Metformin , may want to consider adding Tradjenta 5 mg daily.   HbgA1C: A1C 13.8% on 04/16/24 indicating an average glucose of 349 mg/dl over the past 2-3 months. A1C of 5.6% on 12/31/23 noted in Care Everywhere.   Outpatient DM: Patient's insurance covers Lantus  insulin  pens [phone]) and would also need insulin  pen needles [phone]).  Addendum 04/22/24@12 :20-Spoke with patient and her daughter [name] at bedside about new diabetes diagnosis. [name] states that patient has a CNA come out Monday-Friday, she has peer support come out 2 times a week, and a nurse to administer medication for schizophrenia once a month. Arena states that Tray (a female friend) lives with patient.  Patient more awake and alert today and engaged  in conversation.  Discussed A1C results (13.8% on 04/16/24) and explained what an A1C is and informed patient that current A1C indicates an average glucose of 349 mg/dl over the past 2-3 months. Discussed basic pathophysiology of DM Type 2, basic home care, importance of checking CBGs and maintaining good CBG control to prevent long-term and short-term complications. Reviewed glucose and A1C goals.  Reviewed signs and symptoms of hyperglycemia and hypoglycemia along with treatment for both. Discussed impact of nutrition, exercise, stress, sickness, and medications on diabetes control. Discussed carb modified diet, how to read nutrition label, portion control, and plate method.  [name] does the grocery shopping and will start looking at nutrition labels. Patient drinks mostly water and kool aid; encouraged them to eliminate sugary beverages; encouraged to use sugar free kool aid and sugar free flavor packs for water if needed. Encouraged patient to be active at least 30 mins a day.  Reviewed Living Well with diabetes booklet and encouraged patient to read through entire book. Discussed how recent steroids have contributed to A1C result; per chart patient seen PCP on 02/26/24 and was prescribed Prednisone  taper. Seen on 03/21/24 and given steroid injection plus prescribed Prednisone  taper. Discussed plan to use basal insulin  once a day along with oral DM medication(s) for DM control. [name] is concerned about patient safely being able to give herself insulin . [name] states she will plan to go by daily to give patient her insulin  injection and to help patient work on  learning how to safely administer it on her own. She asked about patient using an insulin  pump (she reports patient's behavioral health provider recommended.  Informed them that patient would need to get established with an Endocrinologist in order to get on an insulin  pump.  Encouraged them to talk with patient's PCP about being referred to Endocrinologist if  they wanted to peruse using an insulin  pump.  Discussed Lantus  insulin  and how it works and copay is $4.90 with insurance. Discussed insulin  storage, injection sites, importance of rotating injection sites. Educated patient and spouse on insulin  pen use at home. Reviewed contents of insulin  flexpen starter kit. Reviewed all steps of insulin  pen including attachment of needle, 2-unit air shot, dialing up dose, giving injection, removing needle, disposal of sharps, storage of unused insulin , disposal of insulin  etc. Patient needed verbal cues and some hands on assistance in order to return demonstration of using the insulin  pen. [name] was able to successfully demonstrate how to use the insulin  pen without any assistance. Encouraged Arena to continue to work with her mother and let her mother perform the steps with assistance so she can learn how to safely self administer insulin .  Reviewed hypoglycemia along with treatment. Encouraged them to be sure to call PCP if she has any issues at all with hypoglycemia. Patient and [name] verbalized understanding of information discussed and they have no further questions at this time related to diabetes and insulin .   RNs to provide ongoing basic DM education at bedside with this patient and engage patient to actively check blood glucose and administer insulin  injections.   Thanks, Earnie Gainer, RN, MSN, CDCES Diabetes Coordinator Inpatient Diabetes Program [phone] (Team Pager from 8am to 5pm)
